# Patient Record
Sex: Male | Born: 1945 | Race: Black or African American | Hispanic: No | Marital: Married | Smoking: Never smoker
Health system: Southern US, Community
[De-identification: ages and names within clinical notes are randomized; demographics above are authoritative.]

## PROBLEM LIST (undated history)

## (undated) DIAGNOSIS — I10 Essential (primary) hypertension: Secondary | ICD-10-CM

## (undated) DIAGNOSIS — I2510 Atherosclerotic heart disease of native coronary artery without angina pectoris: Secondary | ICD-10-CM

## (undated) HISTORY — PX: CARDIAC SURGERY: SHX584

---

## 2016-07-08 ENCOUNTER — Emergency Department (HOSPITAL_COMMUNITY): Payer: Medicare Other

## 2016-07-08 ENCOUNTER — Encounter (HOSPITAL_COMMUNITY): Payer: Self-pay | Admitting: *Deleted

## 2016-07-08 ENCOUNTER — Inpatient Hospital Stay (HOSPITAL_COMMUNITY)
Admission: EM | Admit: 2016-07-08 | Discharge: 2016-07-17 | DRG: 299 | Disposition: A | Discharge status: Home Health | Payer: Medicare Other | Attending: Internal Medicine | Admitting: Internal Medicine

## 2016-07-08 DIAGNOSIS — E878 Other disorders of electrolyte and fluid balance, not elsewhere classified: Secondary | ICD-10-CM | POA: Diagnosis present

## 2016-07-08 DIAGNOSIS — J811 Chronic pulmonary edema: Secondary | ICD-10-CM

## 2016-07-08 DIAGNOSIS — E785 Hyperlipidemia, unspecified: Secondary | ICD-10-CM | POA: Diagnosis present

## 2016-07-08 DIAGNOSIS — Z951 Presence of aortocoronary bypass graft: Secondary | ICD-10-CM

## 2016-07-08 DIAGNOSIS — N179 Acute kidney failure, unspecified: Secondary | ICD-10-CM | POA: Diagnosis present

## 2016-07-08 DIAGNOSIS — M7989 Other specified soft tissue disorders: Secondary | ICD-10-CM | POA: Diagnosis not present

## 2016-07-08 DIAGNOSIS — I7101 Dissection of thoracic aorta: Secondary | ICD-10-CM | POA: Diagnosis present

## 2016-07-08 DIAGNOSIS — I129 Hypertensive chronic kidney disease with stage 1 through stage 4 chronic kidney disease, or unspecified chronic kidney disease: Secondary | ICD-10-CM | POA: Diagnosis present

## 2016-07-08 DIAGNOSIS — I2510 Atherosclerotic heart disease of native coronary artery without angina pectoris: Secondary | ICD-10-CM | POA: Diagnosis present

## 2016-07-08 DIAGNOSIS — E1151 Type 2 diabetes mellitus with diabetic peripheral angiopathy without gangrene: Secondary | ICD-10-CM | POA: Diagnosis present

## 2016-07-08 DIAGNOSIS — I714 Abdominal aortic aneurysm, without rupture: Secondary | ICD-10-CM | POA: Diagnosis present

## 2016-07-08 DIAGNOSIS — E871 Hypo-osmolality and hyponatremia: Secondary | ICD-10-CM | POA: Diagnosis not present

## 2016-07-08 DIAGNOSIS — E876 Hypokalemia: Secondary | ICD-10-CM | POA: Diagnosis not present

## 2016-07-08 DIAGNOSIS — E861 Hypovolemia: Secondary | ICD-10-CM | POA: Diagnosis not present

## 2016-07-08 DIAGNOSIS — K5900 Constipation, unspecified: Secondary | ICD-10-CM | POA: Diagnosis not present

## 2016-07-08 DIAGNOSIS — I161 Hypertensive emergency: Secondary | ICD-10-CM | POA: Diagnosis present

## 2016-07-08 DIAGNOSIS — R0600 Dyspnea, unspecified: Secondary | ICD-10-CM

## 2016-07-08 DIAGNOSIS — N289 Disorder of kidney and ureter, unspecified: Secondary | ICD-10-CM | POA: Diagnosis not present

## 2016-07-08 DIAGNOSIS — J189 Pneumonia, unspecified organism: Secondary | ICD-10-CM | POA: Diagnosis present

## 2016-07-08 DIAGNOSIS — Q613 Polycystic kidney, unspecified: Secondary | ICD-10-CM

## 2016-07-08 DIAGNOSIS — E1122 Type 2 diabetes mellitus with diabetic chronic kidney disease: Secondary | ICD-10-CM | POA: Diagnosis present

## 2016-07-08 DIAGNOSIS — N183 Chronic kidney disease, stage 3 (moderate): Secondary | ICD-10-CM | POA: Diagnosis present

## 2016-07-08 DIAGNOSIS — R079 Chest pain, unspecified: Secondary | ICD-10-CM | POA: Diagnosis not present

## 2016-07-08 DIAGNOSIS — J9690 Respiratory failure, unspecified, unspecified whether with hypoxia or hypercapnia: Secondary | ICD-10-CM | POA: Diagnosis not present

## 2016-07-08 DIAGNOSIS — I82412 Acute embolism and thrombosis of left femoral vein: Secondary | ICD-10-CM | POA: Diagnosis present

## 2016-07-08 DIAGNOSIS — Z8679 Personal history of other diseases of the circulatory system: Secondary | ICD-10-CM

## 2016-07-08 DIAGNOSIS — I71019 Dissection of thoracic aorta, unspecified: Secondary | ICD-10-CM | POA: Diagnosis present

## 2016-07-08 DIAGNOSIS — R262 Difficulty in walking, not elsewhere classified: Secondary | ICD-10-CM

## 2016-07-08 DIAGNOSIS — D649 Anemia, unspecified: Secondary | ICD-10-CM | POA: Diagnosis present

## 2016-07-08 DIAGNOSIS — I7102 Dissection of abdominal aorta: Secondary | ICD-10-CM | POA: Diagnosis not present

## 2016-07-08 DIAGNOSIS — I70213 Atherosclerosis of native arteries of extremities with intermittent claudication, bilateral legs: Secondary | ICD-10-CM | POA: Diagnosis not present

## 2016-07-08 DIAGNOSIS — M549 Dorsalgia, unspecified: Secondary | ICD-10-CM | POA: Diagnosis present

## 2016-07-08 HISTORY — DX: Atherosclerotic heart disease of native coronary artery without angina pectoris: I25.10

## 2016-07-08 HISTORY — DX: Essential (primary) hypertension: I10

## 2016-07-08 LAB — CBC WITH DIFFERENTIAL/PLATELET
Basophils Absolute: 0 10*3/uL (ref 0.0–0.1)
Basophils Relative: 0 %
EOS PCT: 2 %
Eosinophils Absolute: 0.2 10*3/uL (ref 0.0–0.7)
HCT: 33.5 % — ABNORMAL LOW (ref 39.0–52.0)
Hemoglobin: 11.1 g/dL — ABNORMAL LOW (ref 13.0–17.0)
LYMPHS ABS: 1 10*3/uL (ref 0.7–4.0)
LYMPHS PCT: 12 %
MCH: 27.1 pg (ref 26.0–34.0)
MCHC: 33.1 g/dL (ref 30.0–36.0)
MCV: 81.9 fL (ref 78.0–100.0)
Monocytes Absolute: 0.4 10*3/uL (ref 0.1–1.0)
Monocytes Relative: 5 %
Neutro Abs: 6.5 10*3/uL (ref 1.7–7.7)
Neutrophils Relative %: 81 %
PLATELETS: 220 10*3/uL (ref 150–400)
RBC: 4.09 MIL/uL — AB (ref 4.22–5.81)
RDW: 15.8 % — ABNORMAL HIGH (ref 11.5–15.5)
WBC: 8.1 10*3/uL (ref 4.0–10.5)

## 2016-07-08 LAB — I-STAT CHEM 8, ED
BUN: 39 mg/dL — AB (ref 6–20)
CALCIUM ION: 1.06 mmol/L — AB (ref 1.15–1.40)
CHLORIDE: 106 mmol/L (ref 101–111)
Creatinine, Ser: 3.5 mg/dL — ABNORMAL HIGH (ref 0.61–1.24)
GLUCOSE: 125 mg/dL — AB (ref 65–99)
HCT: 37 % — ABNORMAL LOW (ref 39.0–52.0)
Hemoglobin: 12.6 g/dL — ABNORMAL LOW (ref 13.0–17.0)
Potassium: 4.3 mmol/L (ref 3.5–5.1)
Sodium: 141 mmol/L (ref 135–145)
TCO2: 22 mmol/L (ref 0–100)

## 2016-07-08 LAB — I-STAT TROPONIN, ED: TROPONIN I, POC: 0.13 ng/mL — AB (ref 0.00–0.08)

## 2016-07-08 MED ORDER — SODIUM CHLORIDE 0.9 % IV SOLN
INTRAVENOUS | Status: DC
  Administered 2016-07-09: 01:00:00 via INTRAVENOUS

## 2016-07-08 MED ORDER — OXYCODONE-ACETAMINOPHEN 5-325 MG PO TABS
1.0000 | ORAL_TABLET | ORAL | Status: DC | PRN
Start: 2016-07-08 — End: 2016-07-17
  Administered 2016-07-09 – 2016-07-16 (×5): 1 via ORAL
  Filled 2016-07-08 (×5): qty 1

## 2016-07-08 MED ORDER — NITROGLYCERIN IN D5W 200-5 MCG/ML-% IV SOLN
0.0000 ug/min | Freq: Once | INTRAVENOUS | Status: AC
  Administered 2016-07-08: 20 ug/min via INTRAVENOUS
  Filled 2016-07-08: qty 250

## 2016-07-08 MED ORDER — FENTANYL CITRATE (PF) 100 MCG/2ML IJ SOLN
100.0000 ug | Freq: Once | INTRAMUSCULAR | Status: AC
  Administered 2016-07-08: 100 ug via INTRAVENOUS
  Filled 2016-07-08: qty 2

## 2016-07-08 MED ORDER — MORPHINE SULFATE (PF) 4 MG/ML IV SOLN
2.0000 mg | INTRAVENOUS | Status: DC | PRN
  Administered 2016-07-09 – 2016-07-16 (×3): 2 mg via INTRAVENOUS
  Filled 2016-07-08 (×3): qty 1

## 2016-07-08 MED ORDER — SODIUM CHLORIDE 0.9 % IV SOLN: INTRAVENOUS | Status: DC | PRN

## 2016-07-08 MED ORDER — ESMOLOL HCL-SODIUM CHLORIDE 2000 MG/100ML IV SOLN
25.0000 ug/kg/min | INTRAVENOUS | Status: DC
  Administered 2016-07-09: 40 ug/kg/min via INTRAVENOUS
  Administered 2016-07-09: 220 ug/kg/min via INTRAVENOUS
  Administered 2016-07-09: 280 ug/kg/min via INTRAVENOUS
  Administered 2016-07-09: 100 ug/kg/min via INTRAVENOUS
  Administered 2016-07-09: 160 ug/kg/min via INTRAVENOUS
  Administered 2016-07-09: 200 ug/kg/min via INTRAVENOUS
  Administered 2016-07-09: 300 ug/kg/min via INTRAVENOUS
  Filled 2016-07-08 (×9): qty 100

## 2016-07-08 MED ORDER — INSULIN ASPART 100 UNIT/ML ~~LOC~~ SOLN
0.0000 [IU] | Freq: Three times a day (TID) | SUBCUTANEOUS | Status: DC
  Administered 2016-07-09 – 2016-07-14 (×9): 1 [IU] via SUBCUTANEOUS

## 2016-07-08 MED ORDER — ESMOLOL HCL-SODIUM CHLORIDE 2000 MG/100ML IV SOLN
25.0000 ug/kg/min | Freq: Once | INTRAVENOUS | Status: AC
Start: 2016-07-08 — End: 2016-07-08
  Administered 2016-07-08: 25 ug/kg/min via INTRAVENOUS
  Filled 2016-07-08: qty 100

## 2016-07-08 MED ORDER — INSULIN ASPART 100 UNIT/ML ~~LOC~~ SOLN: 0.0000 [IU] | Freq: Every day | SUBCUTANEOUS | Status: DC

## 2016-07-08 MED ORDER — ACETAMINOPHEN 325 MG PO TABS: 650.0000 mg | ORAL_TABLET | Freq: Four times a day (QID) | ORAL | Status: DC | PRN

## 2016-07-08 NOTE — ED Triage Notes (Signed)
Patient was at dinner and began having severe back pain. EMS reports that patient was very hypertensive. BP- 290 palpated. .4 ntg and 324 mg ASA given per EMS

## 2016-07-08 NOTE — ED Notes (Signed)
Dr. Rubin PayorPickering was informed of this critical lab (KT)..Marland Kitchen

## 2016-07-08 NOTE — H&P (Signed)
PULMONARY / CRITICAL CARE MEDICINE   Name: Mario Mccann MRN: [id number] DOB: [birth year]    ADMISSION DATE:  07/08/2016  REFERRING MD:  Dr. Rubin PayorPickering, ER  CHIEF COMPLAINT:  Back pain  HISTORY OF PRESENT ILLNESS:   70 yo male was eating dinner and then developed severe back pain.  It was painful for him to take a deep breath, and he felt anxious.  He came to ER.  He was noted to have BP 230/137.  His CXR showed tortuous aorta.  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  He lived in [place] and received medical care there.  [place], and was in process of getting medical care set up with [facility] - he has not been assigned a primary care provider yet.  PAST MEDICAL HISTORY :  He  has a past medical history of Coronary artery disease and Hypertension.  PAST SURGICAL HISTORY: He  has a past surgical history that includes Cardiac surgery.  No Known Allergies  OUTPATIENT MEDICATIONS: He is not sure what medications he takes  FAMILY HISTORY:  His family history is significant for CAD, HTN  SOCIAL HISTORY: He denies history of smoking or excessive alcohol use  REVIEW OF SYSTEMS:   Negative except above  SUBJECTIVE:   VITAL SIGNS: BP (!) 184/122   Pulse 92   Temp 97.9 F (36.6 C) (Oral)   Resp 15   Ht 6\' 1"  (1.854 m)   Wt 198 lb (89.8 kg)   SpO2 97%   BMI 26.12 kg/m   INTAKE / OUTPUT: No intake/output data recorded.  PHYSICAL EXAMINATION: General:  alert Neuro:  Normal strength, CN intact HEENT:  Pupils reactive, wears glasses, no stridor, no LAN Cardiovascular:  Regular, no murmur, mid line chest scar Lungs:  No wheeze/rales Abdomen:  Soft, non tender, no organomegaly Musculoskeletal:  Mild ankle edema Skin: venous stasis changes  LABS:  BMET  Recent Labs Lab 07/08/16 2002  NA 141  K 4.3  CL 106  BUN 39*  CREATININE 3.50*  GLUCOSE 125*    Electrolytes No results  for input(s): CALCIUM, MG, PHOS in the last 168 hours.  CBC  Recent Labs Lab 07/08/16 1945 07/08/16 2002  WBC 8.1  --   HGB 11.1* 12.6*  HCT 33.5* 37.0*  PLT 220  --     Coag's No results for input(s): APTT, INR in the last 168 hours.  Sepsis Markers No results for input(s): LATICACIDVEN, PROCALCITON, O2SATVEN in the last 168 hours.  ABG No results for input(s): PHART, PCO2ART, PO2ART in the last 168 hours.  Liver Enzymes No results for input(s): AST, ALT, ALKPHOS, BILITOT, ALBUMIN in the last 168 hours.  Cardiac Enzymes No results for input(s): TROPONINI, PROBNP in the last 168 hours.  Glucose No results for input(s): GLUCAP in the last 168 hours.  Imaging Mr Maxine GlennMra Chest Wo Contrast  Result Date: 07/08/2016 CLINICAL DATA:  Severe back pain and hypertension. EXAM: MRA CHEST WITH OR WITHOUT CONTRAST TECHNIQUE: Angiographic images of the chest were obtained using MRA technique without and with intravenous contrast. CONTRAST:  None COMPARISON:  Chest radiograph 07/08/2016 FINDINGS: There is a type B dissection of the thoracic aorta, beginning just beyond the origin of the left subclavian artery and continuing throughout the descending thoracic aorta. Normal caliber of the stent aorta and arch through the great vessel origins. Great vessel origins are patent. The dissection appears to end just above  the diaphragmatic hiatus. No pleural effusions. At the bottom of this study, there is a strong suggestion of an infrarenal abdominal aortic aneurysm which could measure over 5 cm. This is poorly seen and the images at this level are degraded by motion artifact. Innumerable cysts throughout the liver and both kidneys, incompletely imaged. IMPRESSION: 1. Type B thoracic aortic dissection extending to just above the diaphragmatic hiatus. 2. Probable infrarenal abdominal aortic aneurysm, possibly over 5 cm. 3. Normal caliber of the ascending aorta and arch through the great vessel origins. Great  vessel origins are patent. 4. These results will be called to the ordering clinician or representative by the Radiologist Assistant, and communication documented in the PACS or zVision Dashboard. Electronically Signed   By: Ellery Plunkaniel R Mitchell M.D.   On: 07/08/2016 22:53   Dg Chest Portable 1 View  Result Date: 07/08/2016 CLINICAL DATA:  Severe back pain EXAM: PORTABLE CHEST 1 VIEW COMPARISON:  None. FINDINGS: Cardiac shadow is at the upper limits of normal in size. Postsurgical changes are seen. Tortuosity of the thoracic aorta is noted. Aortic calcifications are seen. No acute bony abnormality is noted. The lungs are clear. IMPRESSION: Tortuous thoracic aorta. Given the patient's history the possibility of dissection deserves consideration. Electronically Signed   By: Alcide CleverMark  Lukens M.D.   On: 07/08/2016 20:16     STUDIES:  MRA chest 11/03 >> type B aortic dissection from just beyond Lt subclavian throughout descending thoracic aorta ending about diaphragmatic hiatus, infrarenal AAA 5 cm, liver/renal cysts  CULTURES:  ANTIBIOTICS:  SIGNIFICANT EVENTS: 11/03 Admit  LINES/TUBES:  DISCUSSION: 70 yo male with sudden onset of back pain from HTN emergency and type B aortic dissection.  ASSESSMENT / PLAN:  HTN emergency with type B aortic dissection. Hx of CAD s/p CABG, HTN, HLD. PAD with infrarenal AAA. - esmolol gtt - goal SBP < 130, HR < 80 - f/u Echo - will likely need further assessment by vascular surgery - will need to get records from medical team in [place]  DM type II. - SSI  CKD 3 - not sure what baseline renal fx is. - monitor renal fx, urine outpt  Pain control. - prn analgesics  Nutrition. - clear liquid diet   DVT prophylaxis - SCDs SUP - not indicated Goals of care - full code  CC time 32 minutes  Coralyn HellingVineet Jannae Fagerstrom, MD [facility] Pulmonary/Critical Care 07/08/2016, 11:37 PM Pager:  [phone] After 3pm call: [phone]

## 2016-07-08 NOTE — ED Provider Notes (Signed)
[facility]-EMERGENCY DEPT Provider Note   CSN: [id number] Arrival date & time: 07/08/16  1851     History   Chief Complaint Chief Complaint  Patient presents with  . Back Pain    HPI Mario Mccann is a 70 y.o. male.  HPI Patient was at dinner he developed acute mid thoracic back pain it is dull. No nausea vomiting. No numbness weakness. Previous history of MI CABG and stents. Last cath was about a year ago. Pain is now down to his left chest also. He was found to be severely hypertensive with a palpate a blood pressure 290 by EMS. Systolic of 230 here. States he did not take his medicines today because he was busy. States he had some improvement in the pain with nitroglycerin given to him by EMS. States he also took 4 baby aspirin.    Past Medical History:  Diagnosis Date  . Coronary artery disease   . Hypertension     There are no active problems to display for this patient.   Past Surgical History:  Procedure Laterality Date  . CARDIAC SURGERY         Home Medications    Prior to Admission medications   Not on File    Family History No family history on file.  Social History Social History  Substance Use Topics  . Smoking status: Not on file  . Smokeless tobacco: Not on file  . Alcohol use Not on file     Allergies   Review of patient's allergies indicates not on file.   Review of Systems Review of Systems  Constitutional: Negative for appetite change.  HENT: Negative for drooling.   Respiratory: Negative for shortness of breath.   Cardiovascular: Positive for chest pain.  Gastrointestinal: Negative for abdominal pain.  Genitourinary: Negative for difficulty urinating.  Musculoskeletal: Negative for back pain.  Neurological: Negative for numbness.  Psychiatric/Behavioral: Negative for confusion.     Physical Exam Updated Vital Signs BP (!) 197/123   Pulse 96   Temp 97.9 F (36.6 C) (Oral)   Resp 18   Ht 6\' 1"  (1.854 m)   Wt 198 lb (89.8  kg)   SpO2 94%   BMI 26.12 kg/m   Physical Exam  Constitutional: He appears well-developed.  HENT:  Head: Atraumatic.  Neck: Neck supple.  Cardiovascular: Normal rate.   Scar on mid chest from previous CABG  Pulmonary/Chest: Effort normal.  Abdominal: Soft. There is no tenderness.  Musculoskeletal: He exhibits no edema.  Neurological: He is alert.  Skin: Skin is warm. Capillary refill takes less than 2 seconds.     ED Treatments / Results  Labs (all labs ordered are listed, but only abnormal results are displayed) Labs Reviewed  CBC WITH DIFFERENTIAL/PLATELET - Abnormal; Notable for the following:       Result Value   RBC 4.09 (*)    Hemoglobin 11.1 (*)    HCT 33.5 (*)    RDW 15.8 (*)    All other components within normal limits  I-STAT CHEM 8, ED - Abnormal; Notable for the following:    BUN 39 (*)    Creatinine, Ser 3.50 (*)    Glucose, Bld 125 (*)    Calcium, Ion 1.06 (*)    Hemoglobin 12.6 (*)    HCT 37.0 (*)    All other components within normal limits  I-STAT TROPOININ, ED - Abnormal; Notable for the following:    Troponin i, poc 0.13 (*)    All other  components within normal limits    EKG  EKG Interpretation  Date/Time:  Friday July 08 2016 18:52:31 EDT Ventricular Rate:  81 PR Interval:    QRS Duration: 98 QT Interval:  393 QTC Calculation: 457 R Axis:   -34 Text Interpretation:  Sinus rhythm Probable left atrial enlargement Left ventricular hypertrophy Confirmed by Rubin Payor  MD, Harrold Donath [phone]) on 07/08/2016 7:30:47 PM       Radiology Mr Maxine Glenn Chest Wo Contrast  Result Date: 07/08/2016 CLINICAL DATA:  Severe back pain and hypertension. EXAM: MRA CHEST WITH OR WITHOUT CONTRAST TECHNIQUE: Angiographic images of the chest were obtained using MRA technique without and with intravenous contrast. CONTRAST:  None COMPARISON:  Chest radiograph 07/08/2016 FINDINGS: There is a type B dissection of the thoracic aorta, beginning just beyond the origin of  the left subclavian artery and continuing throughout the descending thoracic aorta. Normal caliber of the stent aorta and arch through the great vessel origins. Great vessel origins are patent. The dissection appears to end just above the diaphragmatic hiatus. No pleural effusions. At the bottom of this study, there is a strong suggestion of an infrarenal abdominal aortic aneurysm which could measure over 5 cm. This is poorly seen and the images at this level are degraded by motion artifact. Innumerable cysts throughout the liver and both kidneys, incompletely imaged. IMPRESSION: 1. Type B thoracic aortic dissection extending to just above the diaphragmatic hiatus. 2. Probable infrarenal abdominal aortic aneurysm, possibly over 5 cm. 3. Normal caliber of the ascending aorta and arch through the great vessel origins. Great vessel origins are patent. 4. These results will be called to the ordering clinician or representative by the Radiologist Assistant, and communication documented in the PACS or zVision Dashboard. Electronically Signed   By: Ellery Plunk M.D.   On: 07/08/2016 22:53   Dg Chest Portable 1 View  Result Date: 07/08/2016 CLINICAL DATA:  Severe back pain EXAM: PORTABLE CHEST 1 VIEW COMPARISON:  None. FINDINGS: Cardiac shadow is at the upper limits of normal in size. Postsurgical changes are seen. Tortuosity of the thoracic aorta is noted. Aortic calcifications are seen. No acute bony abnormality is noted. The lungs are clear. IMPRESSION: Tortuous thoracic aorta. Given the patient's history the possibility of dissection deserves consideration. Electronically Signed   By: Alcide Clever M.D.   On: 07/08/2016 20:16    Procedures Procedures (including critical care time)  Medications Ordered in ED Medications  esmolol (BREVIBLOC) 2000 mg / 100 mL (20 mg/mL) infusion (not administered)  nitroGLYCERIN 50 mg in dextrose 5 % 250 mL (0.2 mg/mL) infusion (30 mcg/min Intravenous Rate/Dose Change  07/08/16 2245)  fentaNYL (SUBLIMAZE) injection 100 mcg (100 mcg Intravenous Given 07/08/16 2238)     Initial Impression / Assessment and Plan / ED Course  I have reviewed the triage vital signs and the nursing notes.  Pertinent labs & imaging results that were available during my care of the patient were reviewed by me and considered in my medical decision making (see chart for details).  Clinical Course    Patient with chest pain that came from the back. States pain is worse in the back. Severe hypertension. Has been off his medicines today. Found to have type B aortic dissection. Also 5 cm abdominal aortic aneurysm. Started on nitroglycerin and was all drips. Fentanyl given for pain which helped. MRI done due to renal insufficiency. Creatinine is 3.5 and unsure of baseline. Patient cannot really tell me much about his history. Discussed with Dr. Jamison Neighbor  from ICU, who will admit the patient.  CRITICAL CARE Performed by: Billee CashingPICKERING,Sanayah Munro R. Total critical care time: 45 minutes Critical care time was exclusive of separately billable procedures and treating other patients. Critical care was necessary to treat or prevent imminent or life-threatening deterioration. Critical care was time spent personally by me on the following activities: development of treatment plan with patient and/or surrogate as well as nursing, discussions with consultants, evaluation of patient's response to treatment, examination of patient, obtaining history from patient or surrogate, ordering and performing treatments and interventions, ordering and review of laboratory studies, ordering and review of radiographic studies, pulse oximetry and re-evaluation of patient's condition.   Final Clinical Impressions(s) / ED Diagnoses   Final diagnoses:  Chest pain  Aortic dissection, thoracic [facility](HCC)    New Prescriptions New Prescriptions   No medications on file     Benjiman CoreNathan Ulice Follett, MD 07/08/16 2302

## 2016-07-08 NOTE — ED Notes (Signed)
Report attempted, RN unable to get report at this time. 

## 2016-07-08 NOTE — ED Notes (Signed)
Patient returned from MRI.

## 2016-07-09 DIAGNOSIS — I70213 Atherosclerosis of native arteries of extremities with intermittent claudication, bilateral legs: Secondary | ICD-10-CM

## 2016-07-09 DIAGNOSIS — N289 Disorder of kidney and ureter, unspecified: Secondary | ICD-10-CM

## 2016-07-09 DIAGNOSIS — I7102 Dissection of abdominal aorta: Secondary | ICD-10-CM

## 2016-07-09 LAB — CBC
HEMATOCRIT: 26.3 % — AB (ref 39.0–52.0)
HEMATOCRIT: 30.2 % — AB (ref 39.0–52.0)
HEMOGLOBIN: 8.6 g/dL — AB (ref 13.0–17.0)
HEMOGLOBIN: 9.9 g/dL — AB (ref 13.0–17.0)
MCH: 26.8 pg (ref 26.0–34.0)
MCH: 27.4 pg (ref 26.0–34.0)
MCHC: 32.7 g/dL (ref 30.0–36.0)
MCHC: 32.8 g/dL (ref 30.0–36.0)
MCV: 81.9 fL (ref 78.0–100.0)
MCV: 83.7 fL (ref 78.0–100.0)
Platelets: 227 10*3/uL (ref 150–400)
Platelets: 190 10*3/uL (ref 150–400)
RBC: 3.21 MIL/uL — ABNORMAL LOW (ref 4.22–5.81)
RBC: 3.61 MIL/uL — AB (ref 4.22–5.81)
RDW: 15.7 % — AB (ref 11.5–15.5)
RDW: 15.7 % — ABNORMAL HIGH (ref 11.5–15.5)
WBC: 7.6 10*3/uL (ref 4.0–10.5)
WBC: 9 10*3/uL (ref 4.0–10.5)

## 2016-07-09 LAB — GLUCOSE, CAPILLARY
GLUCOSE-CAPILLARY: 112 mg/dL — AB (ref 65–99)
GLUCOSE-CAPILLARY: 150 mg/dL — AB (ref 65–99)
Glucose-Capillary: 121 mg/dL — ABNORMAL HIGH (ref 65–99)
Glucose-Capillary: 121 mg/dL — ABNORMAL HIGH (ref 65–99)
Glucose-Capillary: 141 mg/dL — ABNORMAL HIGH (ref 65–99)

## 2016-07-09 LAB — TYPE AND SCREEN
ABO/RH(D): O POS
ANTIBODY SCREEN: NEGATIVE

## 2016-07-09 LAB — BASIC METABOLIC PANEL
ANION GAP: 7 (ref 5–15)
Anion gap: 5 (ref 5–15)
BUN: 29 mg/dL — AB (ref 6–20)
BUN: 37 mg/dL — AB (ref 6–20)
CHLORIDE: 109 mmol/L (ref 101–111)
CO2: 15 mmol/L — ABNORMAL LOW (ref 22–32)
CO2: 22 mmol/L (ref 22–32)
CREATININE: 3.13 mg/dL — AB (ref 0.61–1.24)
Calcium: 6.1 mg/dL — CL (ref 8.9–10.3)
Calcium: 8.4 mg/dL — ABNORMAL LOW (ref 8.9–10.3)
Chloride: 117 mmol/L — ABNORMAL HIGH (ref 101–111)
Creatinine, Ser: 2.38 mg/dL — ABNORMAL HIGH (ref 0.61–1.24)
GFR calc Af Amer: 30 mL/min — ABNORMAL LOW (ref >60)
GFR calc non Af Amer: 19 mL/min — ABNORMAL LOW (ref >60)
GFR, EST AFRICAN AMERICAN: 22 mL/min — AB (ref >60)
GFR, EST NON AFRICAN AMERICAN: 26 mL/min — AB (ref >60)
GLUCOSE: 106 mg/dL — AB (ref 65–99)
Glucose, Bld: 79 mg/dL (ref 65–99)
POTASSIUM: 3.2 mmol/L — AB (ref 3.5–5.1)
POTASSIUM: 4.3 mmol/L (ref 3.5–5.1)
SODIUM: 139 mmol/L (ref 135–145)
SODIUM: 136 mmol/L (ref 135–145)

## 2016-07-09 LAB — MRSA PCR SCREENING: MRSA by PCR: NEGATIVE

## 2016-07-09 LAB — ABO/RH: ABO/RH(D): O POS

## 2016-07-09 LAB — HEMOGLOBIN AND HEMATOCRIT, BLOOD
HCT: 28.5 % — ABNORMAL LOW (ref 39.0–52.0)
Hemoglobin: 9.5 g/dL — ABNORMAL LOW (ref 13.0–17.0)

## 2016-07-09 MED ORDER — SODIUM CHLORIDE 0.9 % IV SOLN
30.0000 meq | Freq: Four times a day (QID) | INTRAVENOUS | Status: AC
  Administered 2016-07-09: 30 meq via INTRAVENOUS
  Filled 2016-07-09 (×2): qty 15

## 2016-07-09 MED ORDER — NICARDIPINE HCL IN NACL 20-0.86 MG/200ML-% IV SOLN
3.0000 mg/h | INTRAVENOUS | Status: DC
  Administered 2016-07-09 (×2): 5 mg/h via INTRAVENOUS
  Administered 2016-07-09: 4 mg/h via INTRAVENOUS
  Administered 2016-07-10 (×2): 3 mg/h via INTRAVENOUS
  Administered 2016-07-10: 6 mg/h via INTRAVENOUS
  Administered 2016-07-10: 7 mg/h via INTRAVENOUS
  Administered 2016-07-10: 6 mg/h via INTRAVENOUS
  Administered 2016-07-11 – 2016-07-12 (×3): 2 mg/h via INTRAVENOUS
  Filled 2016-07-09 (×15): qty 200

## 2016-07-09 MED ORDER — SODIUM CHLORIDE 0.9 % IV SOLN
1.0000 g | Freq: Once | INTRAVENOUS | Status: AC
  Administered 2016-07-09: 1 g via INTRAVENOUS
  Filled 2016-07-09: qty 10

## 2016-07-09 MED ORDER — ONDANSETRON HCL 4 MG/2ML IJ SOLN
INTRAMUSCULAR | Status: AC
  Administered 2016-07-09: 4 mg
  Filled 2016-07-09: qty 2

## 2016-07-09 MED ORDER — ONDANSETRON HCL 4 MG/2ML IJ SOLN
4.0000 mg | Freq: Four times a day (QID) | INTRAMUSCULAR | Status: DC | PRN
  Administered 2016-07-09: 4 mg via INTRAVENOUS
  Filled 2016-07-09: qty 2

## 2016-07-09 MED ORDER — POTASSIUM CHLORIDE 20 MEQ/15ML (10%) PO SOLN
20.0000 meq | Freq: Once | ORAL | Status: AC
  Administered 2016-07-09: 20 meq via ORAL
  Filled 2016-07-09: qty 15

## 2016-07-09 NOTE — Progress Notes (Signed)
eLink Physician-Brief Progress Note Patient Name: Mario Mccann DOB: 16-Oct-1945 MRN: [id number]   Date of Service  07/09/2016  HPI/Events of Note  K 3.2. Calcium 6.1. Has PIV. Creatinine 2.38. Taking PO.  eICU Interventions  1. Checking ionized calcium 2. Stat Type & Screen 3. Repeat Hgb/Hct @ noon 4. Calcium Gluconate 1gm IV 5. KCl 30mEq PO x1     Intervention Category Intermediate Interventions: Electrolyte abnormality - evaluation and management  Mario Mccann 07/09/2016, 6:36 AM

## 2016-07-09 NOTE — Progress Notes (Signed)
PULMONARY / CRITICAL CARE MEDICINE   Name: Mario RoyalsCarl Mccann MRN: [id number] DOB: June 06, 1946    ADMISSION DATE:  07/08/2016  REFERRING MD:  Dr. Rubin PayorPickering, ER  CHIEF COMPLAINT:  Back pain  HISTORY OF PRESENT ILLNESS:   70 yo male was eating dinner and then developed severe back pain.  It was painful for him to take a deep breath, and he felt anxious.  He came to ER.  He was noted to have BP 230/137.  His CXR showed tortuous aorta.  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  He lived in [place] and received medical care there.  [place], and was in process of getting medical care set up with [facility] - he has not been assigned a primary care provider yet.   SUBJECTIVE:  Pain improved.  Having some nausea BP not at goal as of yet  VITAL SIGNS: BP (!) 173/106   Pulse 75   Temp 97.6 F (36.4 C) (Oral)   Resp (!) 23   Ht 6\' 1"  (1.854 m)   Wt 189 lb (85.7 kg)   SpO2 90%   BMI 24.94 kg/m   INTAKE / OUTPUT: I/O last 3 completed shifts: In: 641 [P.O.:360; I.V.:281] Out: 225 [Urine:225]  PHYSICAL EXAMINATION: General:  Alert; no distress.  Neuro:  Normal strength, CN intact HEENT:  Pupils reactive, wears glasses, no stridor, no LAN Cardiovascular:  Regular, no murmur, mid line chest scar Lungs:  No wheeze/rales, no accessory use  Abdomen:  Soft, non tender, no organomegaly Musculoskeletal:  Mild ankle edema Skin: venous stasis changes  LABS:  BMET  Recent Labs Lab 07/08/16 2002 07/09/16 0500  NA 141 139  K 4.3 3.2*  CL 106 117*  CO2  --  15*  BUN 39* 29*  CREATININE 3.50* 2.38*  GLUCOSE 125* 79    Electrolytes  Recent Labs Lab 07/09/16 0500  CALCIUM 6.1*    CBC  Recent Labs Lab 07/08/16 1945 07/08/16 2002 07/09/16 0500  WBC 8.1  --  7.6  HGB 11.1* 12.6* 8.6*  HCT 33.5* 37.0* 26.3*  PLT 220  --  227    Coag's No results for input(s): APTT, INR in the last 168  hours.  Sepsis Markers No results for input(s): LATICACIDVEN, PROCALCITON, O2SATVEN in the last 168 hours.  ABG No results for input(s): PHART, PCO2ART, PO2ART in the last 168 hours.  Liver Enzymes No results for input(s): AST, ALT, ALKPHOS, BILITOT, ALBUMIN in the last 168 hours.  Cardiac Enzymes No results for input(s): TROPONINI, PROBNP in the last 168 hours.  Glucose  Recent Labs Lab 07/09/16 0042 07/09/16 0754  GLUCAP 121* 112*    Imaging Mr Mra Chest Wo Contrast  Result Date: 07/08/2016 CLINICAL DATA:  Severe back pain and hypertension. EXAM: MRA CHEST WITH OR WITHOUT CONTRAST TECHNIQUE: Angiographic images of the chest were obtained using MRA technique without and with intravenous contrast. CONTRAST:  None COMPARISON:  Chest radiograph 07/08/2016 FINDINGS: There is a type B dissection of the thoracic aorta, beginning just beyond the origin of the left subclavian artery and continuing throughout the descending thoracic aorta. Normal caliber of the stent aorta and arch through the great vessel origins. Great vessel origins are patent. The dissection appears to end just above the diaphragmatic hiatus. No pleural effusions. At the bottom of this study, there is a strong suggestion of an infrarenal abdominal aortic aneurysm which could measure over 5 cm.  This is poorly seen and the images at this level are degraded by motion artifact. Innumerable cysts throughout the liver and both kidneys, incompletely imaged. IMPRESSION: 1. Type B thoracic aortic dissection extending to just above the diaphragmatic hiatus. 2. Probable infrarenal abdominal aortic aneurysm, possibly over 5 cm. 3. Normal caliber of the ascending aorta and arch through the great vessel origins. Great vessel origins are patent. 4. These results will be called to the ordering clinician or representative by the Radiologist Assistant, and communication documented in the PACS or zVision Dashboard. Electronically Signed   By:  Daniel R Mitchell M.D.   On: 07/08/2016 22:53   Dg Chest Portable 1 View  Result Date: 07/08/2016 CLINICAL DATA:  SevereEllery Plunk back pain EXAM: PORTABLE CHEST 1 VIEW COMPARISON:  None. FINDINGS: Cardiac shadow is at the upper limits of normal in size. Postsurgical changes are seen. Tortuosity of the thoracic aorta is noted. Aortic calcifications are seen. No acute bony abnormality is noted. The lungs are clear. IMPRESSION: Tortuous thoracic aorta. Given the patient's history the possibility of dissection deserves consideration. Electronically Signed   By: Alcide CleverMark  Lukens M.D.   On: 07/08/2016 20:16     STUDIES:  MRA chest 11/03 >> type B aortic dissection from just beyond Lt subclavian throughout descending thoracic aorta ending about diaphragmatic hiatus, infrarenal AAA 5 cm, liver/renal cysts  CULTURES:  ANTIBIOTICS:  SIGNIFICANT EVENTS: 11/03 Admit 11/4 added nicardipine as esmolol maxed out. Asked vascular to see  LINES/TUBES:  DISCUSSION: 70 yo male with sudden onset of back pain from HTN emergency and type B aortic dissection. Working on BP control. Spoke w/ Dr Edilia Boickson. He will review imaging w/ [facility] on 11/5 and ask him to make recommendations on f/u imaging etc...  ASSESSMENT / PLAN:  HTN emergency with type B aortic dissection. Hx of CAD s/p CABG, HTN, HLD. PAD with infrarenal AAA. - esmolol gtt; adding nicardipine  - goal SBP < 130, HR < 80 - f/u Echo - vascular to review imaging on 11/5, although will ask them to emergently assess should he develop evidence of end-organ dysfxn.  - will need to get records from medical team in [place]  DM type II. - SSI  CKD 3 - not sure what baseline renal fx is-->cr improved.  NAGMA in setting of hyperchloremia Hypokalemia  - control BP - allow free water intake - monitor renal fx, urine outpt - replace K  - f/u am labs  Pain control. - prn analgesics  Nutrition. - clear liquid diet   DVT prophylaxis - SCDs SUP - not  indicated Goals of care - full code   Simonne MartinetPeter E Morganne Haile ACNP-BC [facility] Pulmonary/Critical Care Pager # [phone] OR # [phone] if no answer

## 2016-07-09 NOTE — ED Notes (Signed)
Pt requesting some medication for leg cramping at this time.

## 2016-07-09 NOTE — Progress Notes (Signed)
CRITICAL VALUE ALERT  Critical value received:  Ca++ 6.1  Date of notification:  07/09/16  Time of notification:  0630  Critical value read back: Yes   Nurse who received alert:  M. Manson PasseyBrown, RN  MD notified (1st page):  Dr. Jamison NeighborNestor  Time of first page:  [phone]  MD notified (2nd page):  Time of second page:  Responding MD:  Dr. Jamison NeighborNestor  Time MD responded:  [phone]

## 2016-07-09 NOTE — Consult Note (Addendum)
Patient name: Mario Mccann MRN: [id number] DOB: Sep 14, 1945 Sex: male  REASON FOR CONSULT: Type B aortic dissection. Consult is from critical care medicine.  HPI: Mario RoyalsCarl Mccann is a 70 y.o. male, who developed the sudden onset of back pain. He was admitted and workup included an MRI which showed evidence of a type B aortic dissection. His blood pressure is being controlled by critical care medicine. Vascular surgery was asked to discuss follow up.  His back pain has essentially resolved.  Of note, he did undergo an endovascular aneurysm repair in [place] several months ago. I do not have these records. He was not sure how large the aneurysm was. He had one follow up visit as an outpatient but states that he has not had a follow up CT scan since his repair.  He has had intermittent swelling in the left leg for many years.  He describes bilateral lower extreme claudication which is more significant on the right side. He denies any history of rest pain or nonhealing ulcers.  Past Medical History:  Diagnosis Date  . Coronary artery disease   . Hypertension     No family history on file.  SOCIAL HISTORY: Social History   Social History  . Marital status: Married    Spouse name: N/A  . Number of children: N/A  . Years of education: N/A   Occupational History  . Not on file.   Social History Main Topics  . Smoking status: Not on file  . Smokeless tobacco: Not on file  . Alcohol use Not on file  . Drug use: Unknown  . Sexual activity: Not on file   Other Topics Concern  . Not on file   Social History Narrative  . No narrative on file    No Known Allergies  Current Facility-Administered Medications  Medication Dose Route Frequency Provider Last Rate Last Dose  . 0.9 %  sodium chloride infusion   Intra-arterial PRN Coralyn HellingVineet Sood, MD      . 0.9 %  sodium chloride infusion   Intravenous Continuous Coralyn HellingVineet Sood, MD 10 mL/hr at 07/09/16 0600    . acetaminophen  (TYLENOL) tablet 650 mg  650 mg Oral Q6H PRN Coralyn HellingVineet Sood, MD      . esmolol (BREVIBLOC) 2000 mg / 100 mL (20 mg/mL) infusion  25-300 mcg/kg/min Intravenous Titrated Coralyn HellingVineet Sood, MD 75.4 mL/hr at 07/09/16 1128 280 mcg/kg/min at 07/09/16 1128  . insulin aspart (novoLOG) injection 0-5 Units  0-5 Units Subcutaneous QHS Coralyn HellingVineet Sood, MD      . insulin aspart (novoLOG) injection 0-9 Units  0-9 Units Subcutaneous TID WC Coralyn HellingVineet Sood, MD      . morphine 4 MG/ML injection 2 mg  2 mg Intravenous Q2H PRN Coralyn HellingVineet Sood, MD   2 mg at 07/09/16 0540  . nicardipine (CARDENE) 20mg  in 0.86% saline 200ml IV infusion (0.1 mg/ml)  3-15 mg/hr Intravenous Continuous Simonne MartinetPeter E Babcock, NP 50 mL/hr at 07/09/16 1103 5 mg/hr at 07/09/16 1103  . ondansetron (ZOFRAN) injection 4 mg  4 mg Intravenous Q6H PRN Simonne MartinetPeter E Babcock, NP   4 mg at 07/09/16 1123  . oxyCODONE-acetaminophen (PERCOCET/ROXICET) 5-325 MG per tablet 1 tablet  1 tablet Oral Q4H PRN Coralyn HellingVineet Sood, MD   1 tablet at 07/09/16 0155  . potassium chloride 30 mEq in sodium chloride 0.9 % 265 mL (KCL MULTIRUN) IVPB  30 mEq Intravenous Q6H Simonne MartinetPeter E Babcock, NP        REVIEW OF SYSTEMS:  [X]   denotes positive finding, [ ]  denotes negative finding Cardiac  Comments:  Chest pain or chest pressure:    Shortness of breath upon exertion:    Short of breath when lying flat:    Irregular heart rhythm:        Vascular    Pain in calf, thigh, or hip brought on by ambulation: X Bilateral, right greater than left   Pain in feet at night that wakes you up from your sleep:     Blood clot in your veins:    Leg swelling:  X Left leg, intermittent       Pulmonary    Oxygen at home:    Productive cough:     Wheezing:         Neurologic    Sudden weakness in arms or legs:     Sudden numbness in arms or legs:     Sudden onset of difficulty speaking or slurred speech:    Temporary loss of vision in one eye:     Problems with dizziness:         Gastrointestinal    Blood in stool:       Vomited blood:         Genitourinary    Burning when urinating:     Blood in urine:        Psychiatric    Major depression:         Hematologic    Bleeding problems:    Problems with blood clotting too easily:        Skin    Rashes or ulcers:        Constitutional    Fever or chills:      PHYSICAL EXAM: Vitals:   07/09/16 0800 07/09/16 0815 07/09/16 0830 07/09/16 1100  BP: (!) 138/102  (!) 173/106   Pulse: 60 72 75   Resp: 17 18 (!) 23   Temp:    97.8 F (36.6 C)  TempSrc:    Oral  SpO2: 97% 95% 90%   Weight:      Height:        GENERAL: The patient is a well-nourished male, in no acute distress. The vital signs are documented above. CARDIAC: There is a regular rate and rhythm.  VASCULAR: I do not detect carotid bruits. On the right side, he has a palpable femoral pulse. I cannot palpate a popliteal or pedal pulses. He has a monophasic posterior tibial signal with the Doppler. I cannot obtain a dorsalis pedis signal on the right with the Doppler. On the left side, he has a palpable femoral and popliteal pulse. I cannot palpate pedal pulses. He has a monophasic dorsalis pedis and posterior tibial signal on the left with the Doppler. He has left lower extremity swelling. PULMONARY: There is good air exchange bilaterally without wheezing or rales. ABDOMEN: Soft and non-tender with normal pitched bowel sounds.  MUSCULOSKELETAL: There are no major deformities or cyanosis. NEUROLOGIC: No focal weakness or paresthesias are detected. SKIN: There are no ulcers or rashes noted. PSYCHIATRIC: The patient has a normal affect.  DATA:   MRI: He has a type B aortic dissection which ends above the level of the diaphragmatic hiatus.  MEDICAL ISSUES:  TYPE B AORTIC DISSECTION: This patient has a type B aortic dissection by MRI and his blood pressure is being tightly controlled by critical care medicine. He does not appear to have compromised any branching vessels. He will need  follow up with Dr. Myra GianottiBrabham, and  I will discuss the timing of this with Dr. Myra Gianotti.  STATUS POST PERCUTANEOUS ENDOVASCULAR ANEURYSM REPAIR IN [place]: He tells me that he had an endovascular aneurysm repair in [place] proximally 3 months ago. I do not have these records. I will obtain a CT of the abdomen and pelvis without contrast (given his renal insufficiency) as he tells me that he has not had a postoperative follow up study since his repair. He will need continued follow up of his EVAR.  PERIPHERAL VASCULAR DISEASE: The patient does describe claudication bilaterally but more significantly on the right side. 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 I will obtain baseline ABIs while he is here. We can also follow this as an outpatient.  INTERMITTENT, CHRONIC, LEFT LOWER EXTREMITY SWELLING: He tells me that he has had intermittent swelling of the left lower extremity for some time. I will obtain a venous duplex scan while he is hospitalized. He is unaware of any previous history of DVT.   Waverly Ferrari [facility] [phone]

## 2016-07-09 NOTE — Procedures (Signed)
Arterial Catheter Insertion Procedure Note Serafina RoyalsCarl Boehringer [id number] 08/05/1946  Procedure: Insertion of Arterial Catheter  Indications: Blood pressure monitoring  Procedure Details Consent: Risks of procedure as well as the alternatives and risks of each were explained to the (patient/caregiver).  Consent for procedure obtained. Time Out: Verified patient identification, verified procedure, site/side was marked, verified correct patient position, special equipment/implants available, medications/allergies/relevent history reviewed, required imaging and test results available.  Performed  Maximum sterile technique was used including antiseptics, cap, gloves, gown, hand hygiene, mask and sheet. Skin prep: Chlorhexidine; local anesthetic administered 20 gauge catheter was inserted into right radial artery using the Seldinger technique.  Evaluation Blood flow good; BP tracing good. Complications: No apparent complications.   Vilinda BlanksMike Jr, Burna CashFrank William 07/09/2016

## 2016-07-10 ENCOUNTER — Inpatient Hospital Stay (HOSPITAL_COMMUNITY): Payer: Medicare Other

## 2016-07-10 ENCOUNTER — Encounter (HOSPITAL_COMMUNITY): Payer: Self-pay | Admitting: *Deleted

## 2016-07-10 DIAGNOSIS — N179 Acute kidney failure, unspecified: Secondary | ICD-10-CM

## 2016-07-10 DIAGNOSIS — R079 Chest pain, unspecified: Secondary | ICD-10-CM

## 2016-07-10 LAB — ECHOCARDIOGRAM COMPLETE
EERAT: 10.98
EWDT: 246 ms
FS: 36 % (ref 28–44)
Height: 73 in
IVS/LV PW RATIO, ED: 0.85
LA ID, A-P, ES: 38 mm
LA diam end sys: 38 mm
LA vol A4C: 43 ml
LADIAMINDEX: 1.82 cm/m2
LAVOL: 49.1 mL
LAVOLIN: 23.5 mL/m2
LV E/e' medial: 10.98
LV E/e'average: 10.98
LV PW d: 12.8 mm — AB (ref 0.6–1.1)
LV TDI E'MEDIAL: 6.2
LVELAT: 8.7 cm/s
MV Dec: 246
MV pk E vel: 95.5 m/s
MVPG: 4 mmHg
MVPKAVEL: 105 m/s
RV LATERAL S' VELOCITY: 11.1 cm/s
RV TAPSE: 16.1 mm
TDI e' lateral: 8.7
Weight: 3001.78 oz

## 2016-07-10 LAB — CBC
HEMATOCRIT: 28.6 % — AB (ref 39.0–52.0)
Hemoglobin: 9.4 g/dL — ABNORMAL LOW (ref 13.0–17.0)
MCH: 27 pg (ref 26.0–34.0)
MCHC: 32.9 g/dL (ref 30.0–36.0)
MCV: 82.2 fL (ref 78.0–100.0)
Platelets: 222 10*3/uL (ref 150–400)
RBC: 3.48 MIL/uL — AB (ref 4.22–5.81)
RDW: 15.6 % — AB (ref 11.5–15.5)
WBC: 9.4 10*3/uL (ref 4.0–10.5)

## 2016-07-10 LAB — BASIC METABOLIC PANEL
ANION GAP: 7 (ref 5–15)
Anion gap: 7 (ref 5–15)
BUN: 41 mg/dL — ABNORMAL HIGH (ref 6–20)
BUN: 40 mg/dL — AB (ref 6–20)
CALCIUM: 8.2 mg/dL — AB (ref 8.9–10.3)
CO2: 21 mmol/L — AB (ref 22–32)
CO2: 19 mmol/L — ABNORMAL LOW (ref 22–32)
CREATININE: 3.61 mg/dL — AB (ref 0.61–1.24)
Calcium: 7.8 mg/dL — ABNORMAL LOW (ref 8.9–10.3)
Chloride: 109 mmol/L (ref 101–111)
Chloride: 111 mmol/L (ref 101–111)
Creatinine, Ser: 3.43 mg/dL — ABNORMAL HIGH (ref 0.61–1.24)
GFR, EST AFRICAN AMERICAN: 20 mL/min — AB (ref >60)
GFR, EST AFRICAN AMERICAN: 18 mL/min — AB (ref >60)
GFR, EST NON AFRICAN AMERICAN: 17 mL/min — AB (ref >60)
GFR, EST NON AFRICAN AMERICAN: 16 mL/min — AB (ref >60)
Glucose, Bld: 101 mg/dL — ABNORMAL HIGH (ref 65–99)
Glucose, Bld: 122 mg/dL — ABNORMAL HIGH (ref 65–99)
POTASSIUM: 4.4 mmol/L (ref 3.5–5.1)
POTASSIUM: 4 mmol/L (ref 3.5–5.1)
SODIUM: 137 mmol/L (ref 135–145)
Sodium: 137 mmol/L (ref 135–145)

## 2016-07-10 LAB — GLUCOSE, CAPILLARY
GLUCOSE-CAPILLARY: 126 mg/dL — AB (ref 65–99)
GLUCOSE-CAPILLARY: 115 mg/dL — AB (ref 65–99)
GLUCOSE-CAPILLARY: 91 mg/dL (ref 65–99)
Glucose-Capillary: 126 mg/dL — ABNORMAL HIGH (ref 65–99)

## 2016-07-10 LAB — CALCIUM, IONIZED: CALCIUM, IONIZED, SERUM: 4.7 mg/dL (ref 4.5–5.6)

## 2016-07-10 MED ORDER — SODIUM CHLORIDE 0.9 % IV BOLUS (SEPSIS)
500.0000 mL | Freq: Once | INTRAVENOUS | Status: AC
  Administered 2016-07-10: 500 mL via INTRAVENOUS

## 2016-07-10 MED ORDER — FUROSEMIDE 10 MG/ML IJ SOLN
40.0000 mg | Freq: Once | INTRAMUSCULAR | Status: AC
  Administered 2016-07-10: 40 mg via INTRAVENOUS
  Filled 2016-07-10: qty 4

## 2016-07-10 MED ORDER — LABETALOL HCL 200 MG PO TABS
200.0000 mg | ORAL_TABLET | Freq: Three times a day (TID) | ORAL | Status: DC
  Administered 2016-07-10 – 2016-07-13 (×10): 200 mg via ORAL
  Filled 2016-07-10 (×10): qty 1

## 2016-07-10 NOTE — Progress Notes (Signed)
  Echocardiogram 2D Echocardiogram has been performed.  Delcie RochENNINGTON, Ellaree Gear 07/10/2016, 4:02 PM

## 2016-07-10 NOTE — Progress Notes (Signed)
eLink Physician-Brief Progress Note Patient Name: Mario RoyalsCarl Mccann DOB: April 03, 1946 MRN: [id number]   Date of Service  07/10/2016  HPI/Events of Note  Renal Failure - Creatinine = 3.43 >> 3.61. K+ = 4.0 and HCO3- = 19. No indication for acute dialysis.   eICU Interventions  Will order: 1. Bolus with 0.9 NaCl 500 mL IV over 30 minutes per Dr. Marchelle Gearingamaswamy 's sign over request.      Intervention Category Major Interventions: Other:  Lenell AntuSommer,Dakari Cregger Eugene 07/10/2016, 8:47 PM

## 2016-07-10 NOTE — Progress Notes (Signed)
PULMONARY / CRITICAL CARE MEDICINE   Name: Serafina RoyalsCarl Prosser MRN: [id number] DOB: 1946-03-10    ADMISSION DATE:  07/08/2016  REFERRING MD:  Dr. Rubin PayorPickering, ER  CHIEF COMPLAINT:  Back pain  HISTORY OF PRESENT ILLNESS:   70 yo male was eating dinner and then developed severe back pain.  It was painful for him to take a deep breath, and he felt anxious.  He came to ER.  He was noted to have BP 230/137.  His CXR showed tortuous aorta.  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  He lived in [place] and received medical care there.  [place] and was in process of getting medical care set up with [facility] - he has not been assigned a primary care provider yet.   SUBJECTIVE:  Pain still present  No distress.  Creatinine worse   VITAL SIGNS: BP 138/80 (BP Location: Left Arm)   Pulse 71   Temp 98.2 F (36.8 C) (Oral)   Resp (!) 26   Ht 6\' 1"  (1.854 m)   Wt 187 lb 9.8 oz (85.1 kg)   SpO2 100%   BMI 24.75 kg/m   INTAKE / OUTPUT: I/O last 3 completed shifts: In: 3395.8 [P.O.:960; I.V.:2265.8; IV Piggyback:170] Out: 1220 [Urine:1220]  PHYSICAL EXAMINATION: General:  Alert; no distress.  Neuro:  Normal strength, CN intact HEENT:  Pupils reactive, no stridor, no LAN Cardiovascular:  Regular, no murmur, mid line chest scar Lungs:  No wheeze/rales, no accessory use  Abdomen:  Soft, non tender, no organomegaly Musculoskeletal:  Mild ankle edema Skin: venous stasis changes  LABS:  BMET  Recent Labs Lab 07/09/16 0500 07/09/16 1100 07/10/16 0400  NA 139 136 137  K 3.2* 4.3 4.4  CL 117* 109 109  CO2 15* 22 21*  BUN 29* 37* 41*  CREATININE 2.38* 3.13* 3.43*  GLUCOSE 79 106* 101*    Electrolytes  Recent Labs Lab 07/09/16 0500 07/09/16 1100 07/10/16 0400  CALCIUM 6.1* 8.4* 8.2*    CBC  Recent Labs Lab 07/09/16 0500 07/09/16 1100 07/09/16 1700 07/10/16 0400  WBC 7.6  --  9.0 9.4   HGB 8.6* 9.5* 9.9* 9.4*  HCT 26.3* 28.5* 30.2* 28.6*  PLT 227  --  190 222    Coag's No results for input(s): APTT, INR in the last 168 hours.  Sepsis Markers No results for input(s): LATICACIDVEN, PROCALCITON, O2SATVEN in the last 168 hours.  ABG No results for input(s): PHART, PCO2ART, PO2ART in the last 168 hours.  Liver Enzymes No results for input(s): AST, ALT, ALKPHOS, BILITOT, ALBUMIN in the last 168 hours.  Cardiac Enzymes No results for input(s): TROPONINI, PROBNP in the last 168 hours.  Glucose  Recent Labs Lab 07/09/16 0042 07/09/16 0754 07/09/16 1139 07/09/16 1607 07/09/16 2205 07/10/16 0756  GLUCAP 121* 112* 121* 141* 150* 126*    Imaging No results found.   STUDIES:  MRA chest 11/03 >> type B aortic dissection from just beyond Lt subclavian throughout descending thoracic aorta ending about diaphragmatic hiatus, infrarenal AAA 5 cm, liver/renal cysts  CULTURES:  ANTIBIOTICS:  SIGNIFICANT EVENTS: 11/03 Admit 11/4 added nicardipine as esmolol maxed out. Asked vascular to see 11/5 esmolol stopped d/t nausea. BP not at goal. Creatinine worse. Changed BP goal to < 120. Added labetolol Cont nicardipine. Lasix given x 1.  LINES/TUBES:  DISCUSSION: 70 yo male with sudden onset of back pain from HTN emergency and type  B aortic dissection. Spoke w/ Dr Edilia Boickson. He will review imaging w/ Brabham on 11/5 and ask him to make recommendations on f/u imaging etc..Marland Kitchen.His esmolol was stopped d/t nausea. This has resolved but his creatinine is climbing. His BP is not at goal. He still has some pain. I think we need tighter BP control. Will add labetolol, give 1 X lasix, cont nicardipine. Change BP goal to <120.    ASSESSMENT / PLAN:  HTN emergency with type B aortic dissection. Hx of CAD s/p CABG, HTN, HLD. PAD with infrarenal AAA. - cont nicardipine -->titrate (goal SBP <120) - add oral labetolol (200 tid) - adding diuretic X 1 - goal SBP < 130, HR < 80 - f/u  Echo - vascular to make formal recs 11/6; he will need vascular f/u  DM type II. - SSI  CKD 3 - not sure what baseline renal fx is-->cr worse NAGMA in setting of hyperchloremia-->improved Hypokalemia  - tighter control of BP  - allow free water intake - monitor renal fx, urine outpt - replace K  - f/u am labs  Pain control. - prn analgesics  Nutrition. - adv diet   DVT prophylaxis - SCDs SUP - not indicated Goals of care - full code   Simonne MartinetPeter E Sou Nohr ACNP-BC [facility]Critical [facility] Pager # [phone] OR # [phone] if no answer

## 2016-07-11 ENCOUNTER — Inpatient Hospital Stay (HOSPITAL_COMMUNITY): Payer: Medicare Other

## 2016-07-11 DIAGNOSIS — I7101 Dissection of thoracic aorta: Secondary | ICD-10-CM

## 2016-07-11 DIAGNOSIS — M7989 Other specified soft tissue disorders: Secondary | ICD-10-CM

## 2016-07-11 LAB — COMPREHENSIVE METABOLIC PANEL
ALT: 11 U/L — ABNORMAL LOW (ref 17–63)
ANION GAP: 8 (ref 5–15)
AST: 11 U/L — ABNORMAL LOW (ref 15–41)
Albumin: 2.5 g/dL — ABNORMAL LOW (ref 3.5–5.0)
Alkaline Phosphatase: 49 U/L (ref 38–126)
BILIRUBIN TOTAL: 0.6 mg/dL (ref 0.3–1.2)
BUN: 42 mg/dL — ABNORMAL HIGH (ref 6–20)
CO2: 20 mmol/L — ABNORMAL LOW (ref 22–32)
Calcium: 7.7 mg/dL — ABNORMAL LOW (ref 8.9–10.3)
Chloride: 107 mmol/L (ref 101–111)
Creatinine, Ser: 3.75 mg/dL — ABNORMAL HIGH (ref 0.61–1.24)
GFR calc Af Amer: 18 mL/min — ABNORMAL LOW (ref >60)
GFR, EST NON AFRICAN AMERICAN: 15 mL/min — AB (ref >60)
Glucose, Bld: 103 mg/dL — ABNORMAL HIGH (ref 65–99)
POTASSIUM: 4 mmol/L (ref 3.5–5.1)
Sodium: 135 mmol/L (ref 135–145)
TOTAL PROTEIN: 5.9 g/dL — AB (ref 6.5–8.1)

## 2016-07-11 LAB — CBC
HEMATOCRIT: 26.5 % — AB (ref 39.0–52.0)
Hemoglobin: 8.7 g/dL — ABNORMAL LOW (ref 13.0–17.0)
MCH: 26.8 pg (ref 26.0–34.0)
MCHC: 32.8 g/dL (ref 30.0–36.0)
MCV: 81.5 fL (ref 78.0–100.0)
Platelets: 194 10*3/uL (ref 150–400)
RBC: 3.25 MIL/uL — ABNORMAL LOW (ref 4.22–5.81)
RDW: 15.2 % (ref 11.5–15.5)
WBC: 9.2 10*3/uL (ref 4.0–10.5)

## 2016-07-11 LAB — GLUCOSE, CAPILLARY
GLUCOSE-CAPILLARY: 91 mg/dL (ref 65–99)
GLUCOSE-CAPILLARY: 105 mg/dL — AB (ref 65–99)
GLUCOSE-CAPILLARY: 140 mg/dL — AB (ref 65–99)
Glucose-Capillary: 145 mg/dL — ABNORMAL HIGH (ref 65–99)

## 2016-07-11 LAB — URINE MICROSCOPIC-ADD ON

## 2016-07-11 LAB — URINALYSIS, ROUTINE W REFLEX MICROSCOPIC
BILIRUBIN URINE: NEGATIVE
Glucose, UA: NEGATIVE mg/dL
Ketones, ur: NEGATIVE mg/dL
NITRITE: NEGATIVE
PH: 5 (ref 5.0–8.0)
Protein, ur: NEGATIVE mg/dL
SPECIFIC GRAVITY, URINE: 1.019 (ref 1.005–1.030)

## 2016-07-11 LAB — SODIUM, URINE, RANDOM: Sodium, Ur: 20 mmol/L

## 2016-07-11 LAB — TROPONIN I
TROPONIN I: 0.11 ng/mL — AB (ref <0.03)
TROPONIN I: 0.09 ng/mL — AB (ref <0.03)
Troponin I: 0.07 ng/mL (ref <0.03)
Troponin I: 0.07 ng/mL (ref <0.03)

## 2016-07-11 LAB — PHOSPHORUS: PHOSPHORUS: 4.2 mg/dL (ref 2.5–4.6)

## 2016-07-11 LAB — LACTIC ACID, PLASMA: LACTIC ACID, VENOUS: 0.8 mmol/L (ref 0.5–1.9)

## 2016-07-11 LAB — OSMOLALITY, URINE: OSMOLALITY UR: 535 mosm/kg (ref 300–900)

## 2016-07-11 LAB — MAGNESIUM: MAGNESIUM: 1.6 mg/dL — AB (ref 1.7–2.4)

## 2016-07-11 MED ORDER — MAGNESIUM SULFATE 2 GM/50ML IV SOLN
2.0000 g | Freq: Once | INTRAVENOUS | Status: AC
  Administered 2016-07-11: 2 g via INTRAVENOUS
  Filled 2016-07-11: qty 50

## 2016-07-11 MED ORDER — HEPARIN (PORCINE) IN NACL 100-0.45 UNIT/ML-% IJ SOLN
1800.0000 [IU]/h | INTRAMUSCULAR | Status: DC
  Administered 2016-07-11: 1450 [IU]/h via INTRAVENOUS
  Administered 2016-07-12: 1700 [IU]/h via INTRAVENOUS
  Administered 2016-07-13 – 2016-07-17 (×7): 1800 [IU]/h via INTRAVENOUS
  Filled 2016-07-11 (×12): qty 250

## 2016-07-11 MED ORDER — POLYETHYLENE GLYCOL 3350 17 G PO PACK
17.0000 g | PACK | Freq: Every day | ORAL | Status: DC
  Administered 2016-07-11 – 2016-07-16 (×5): 17 g via ORAL
  Filled 2016-07-11 (×5): qty 1

## 2016-07-11 NOTE — Progress Notes (Signed)
Notified CCM of LLE DVT per vascular study, orders to be placed.  Hermina BartersBOWMAN, Bonnetta Allbee M, RN

## 2016-07-11 NOTE — Progress Notes (Signed)
ANTICOAGULATION CONSULT NOTE - Initial Consult  Pharmacy Consult for heparin Indication: DVT  No Known Allergies  Patient Measurements: Height: 6\' 1"  (185.4 cm) Weight: 192 lb 7.4 oz (87.3 kg) IBW/kg (Calculated) : 79.9 Heparin Dosing Weight: 87.3  Vital Signs: Temp: 98.1 F (36.7 C) (11/06 1557) Temp Source: Oral (11/06 1557) BP: 105/63 (11/06 1600) Pulse Rate: 66 (11/06 1600)  Labs:  Recent Labs  07/09/16 1700 07/10/16 0400 07/10/16 1610 07/11/16 0410 07/11/16 1100  HGB 9.9* 9.4*  --  8.7*  --   HCT 30.2* 28.6*  --  26.5*  --   PLT 190 222  --  194  --   CREATININE  --  3.43* 3.61* 3.75*  --   TROPONINI  --   --   --  0.11* 0.09*   Estimated Creatinine Clearance: 21 mL/min (by C-G formula based on SCr of 3.75 mg/dL (H)).  Medical History: Past Medical History:  Diagnosis Date  . Coronary artery disease   . Hypertension    Medications:  Prescriptions Prior to Admission  Medication Sig Dispense Refill Last Dose  . allopurinol (ZYLOPRIM) 100 MG tablet Take 100 mg by mouth daily.   unknown  . amLODipine (NORVASC) 10 MG tablet Take 10 mg by mouth daily.   unknown  . carvedilol (COREG) 25 MG tablet Take 25 mg by mouth 2 (two) times daily with a meal.   unknown  . Cholecalciferol (VITAMIN D) 2000 units tablet Take 2,000 Units by mouth daily.   unknown  . clopidogrel (PLAVIX) 75 MG tablet Take 75 mg by mouth.   unknown  . finasteride (PROSCAR) 5 MG tablet Take 5 mg by mouth daily.   unknown  . isosorbide mononitrate (IMDUR) 60 MG 24 hr tablet Take 60 mg by mouth at bedtime.   unknown  . losartan (COZAAR) 25 MG tablet Take 25 mg by mouth daily.   unknown  . oxybutynin (DITROPAN-XL) 10 MG 24 hr tablet Take 10 mg by mouth at bedtime.   unknown  . atorvastatin (LIPITOR) 80 MG tablet Take 80 mg by mouth daily.   unknown  . ezetimibe (ZETIA) 10 MG tablet Take 10 mg by mouth daily.   unknown  . ezetimibe-simvastatin (VYTORIN) 10-20 MG tablet Take 1 tablet by mouth daily.    unknown   Assessment: 70 yo male with uncontrolled hypertension and history of cardiac surgery and AAA repair (11/2015) found to have DVT in the left femoral vein. No anticoagulants prior to admission. SCr 3.25,  HgB 8.7 and Plt 194. Pharmacy has been asked to dose heparin. NO BOLUS per consult.  Goal of Therapy:  Heparin level 0.3-0.7 units/ml Monitor platelets by anticoagulation protocol: Yes   Plan:  Start heparin infusion at 1450 units/hr Check anti-Xa level in 8 hours and daily while on heparin Continue to monitor H&H and platelets  Nalany Steedley L Demya Scruggs 07/11/2016,4:38 PM

## 2016-07-11 NOTE — Progress Notes (Signed)
**  Preliminary report by tech**  Bilateral lower extremity venous duplex completed. There is evidence of age indeterminate vs chronic deep vein thrombosis involving the left femoral vein. The left popliteal vein appears non-compressible but is patent with color and spectral doppler. There is no evidence of superficial vein thrombosis involving the left lower extremity. There is no evidence of deep or superficial vein thrombosis involving the right lower extremity.  There is no evidence of a Baker's cyst bilaterally.  ARTERIAL  ABI completed: Right ABI of 0.64 is suggestive of moderate arterial occlusive disease at rest. Left ABI of 0.82 is suggestive of mild arterial occlusive disease at rest. Right TBI of 0.43 and left TBI of 0.38 are suggestive of abnormal arterial flow at rest.   RIGHT    LEFT    PRESSURE WAVEFORM  PRESSURE WAVEFORM  BRACHIAL 120 Triphasic BRACHIAL 115 Triphasic  DP 77 Monophasic DP 98 Biphasic  PT 75 Monophasic PT 91 Biphasic  GREAT TOE 52 NA GREAT TOE 46 NA    RIGHT LEFT  ABI 0.64 0.82      07/11/16 10:18 AM Olen CordialGreg Mykeal Carrick RVT

## 2016-07-11 NOTE — Progress Notes (Signed)
   VASCULAR SURGERY ASSESSMENT & PLAN:  TYPE B AORTIC DISSECTION: He needs a follow up CT of the abdomen and pelvis given that he had an endovascular aneurysm repair in [place] this March and has not had a follow up study. This will be done without contrast because of his renal insufficiency. I will also scan his chest given his dissection. I have discussed the case with Dr. Durene CalWells Brabham and we will make further recommendations concerning follow-up of his dissection pending his CT scan results.  STATUS POST ENDOVASCULAR ANEURYSM REPAIR IN [place]: According to the patient, he did not have a follow up CT scan after his endovascular aneurysm repair and therefore I will obtain this during this admission. I will do this without contrast given his renal insufficiency. Of note the endovascular aneurysm repair was on 11/23/2015. This was for a 5.4 cm infrarenal abdominal aortic aneurysm.  CHRONIC LEFT LOWER EXTREMITY SWELLING: Although this is chronic, I have ordered a venous duplex of the left lower extremity to rule out a DVT. This is pending.  PERIPHERAL VASCULAR DISEASE: I have ordered ABIs to have as a baseline.  SUBJECTIVE: No specific complaints.   PHYSICAL EXAM: Vitals:   07/11/16 0600 07/11/16 0630 07/11/16 0700 07/11/16 0727  BP: 126/77 117/71 122/74   Pulse: 78 79 77   Resp: (!) 27 (!) 27 (!) 23   Temp:    98.8 F (37.1 C)  TempSrc:    Oral  SpO2: 94% 96% 94%   Weight: 192 lb 7.4 oz (87.3 kg)     Height:       Both feet warm and well perfused.   mild left lower extremity swelling.  LABS:     Lab Results  Component Value Date   CREATININE 3.75 (H) 07/11/2016   Active Problems:   Aortic dissection, thoracic (HCC)   Renal insufficiency   AKI (acute kidney injury) [facility](HCC)  Mario CarawayChris Marcell Mccann Beeper: [phone]: [phone] 07/11/2016

## 2016-07-11 NOTE — Progress Notes (Signed)
CRITICAL VALUE ALERT  Critical value received:  Trop I = 0.11  Date of notification:  t  Time of notification:  0538  Critical value read back:Yes.    Nurse who received alert:  Almedia BallsJ. Cairo Lingenfelter, RN  MD notified (1st page): E-link MD  Time of first page:  [phone]  MD notified (2nd page):  Time of second page:  Responding MD:  Dr Jamison NeighborNestor  Time MD responded:  [phone]

## 2016-07-11 NOTE — Progress Notes (Signed)
eLink Physician-Brief Progress Note Patient Name: Mario RoyalsCarl Mccann DOB: 07-04-1946 MRN: [id number]   Date of Service  07/11/2016  HPI/Events of Note  Notified regarding single troponin I 0.11. Patient admitted with type B aortic dissection. Echocardiogram from 11/5 without focal wall motion abnormality. Elevation likely secondary to aortic dissection and doubt ischemia.   eICU Interventions  Trending troponin I every 6 hours      Intervention Category Intermediate Interventions: Other:  Lawanda CousinsJennings Mayvis Agudelo 07/11/2016, 5:58 AM

## 2016-07-11 NOTE — Progress Notes (Signed)
PULMONARY / CRITICAL CARE MEDICINE   Name: Mario RoyalsCarl Dancer MRN: [id number] DOB: [birth year]    ADMISSION DATE:  07/08/2016  REFERRING MD:  Dr. Rubin PayorPickering, ER  CHIEF COMPLAINT:  Back pain  HISTORY OF PRESENT ILLNESS:   70 yo male was eating dinner and then developed severe back pain.  It was painful for him to take a deep breath, and he felt anxious.  He came to ER.  He was noted to have BP 230/137.  His CXR showed tortuous aorta.  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 He has a history of cardiac surgery and AAA repair in the past.  He lived in [place] and received medical care there.  [place], and was in process of getting medical care set up with [facility] - he has not been assigned a primary care provider yet.  SUBJECTIVE: for CT scan  VITAL SIGNS: BP 122/74   Pulse 77   Temp 98.8 F (37.1 C) (Oral)   Resp (!) 23   Ht 6\' 1"  (1.854 m)   Wt 87.3 kg (192 lb 7.4 oz)   SpO2 94%   BMI 25.39 kg/m   HEMODYNAMICS:    VENTILATOR SETTINGS:    INTAKE / OUTPUT: I/O last 3 completed shifts: In: 3363.8 [P.O.:1060; I.V.:1803.8; IV Piggyback:500] Out: 1385 [Urine:1385]  PHYSICAL EXAMINATION: General:  Comfortable, NAD Neuro:  Awake and alert, CN II-VII grossly intact HEENT:  PEERL, EOMI, [place]/AT, no masses or thyromegaly palpable Cardiovascular:  RRR no mrg, moderate edema in lower extremities Lungs:  CTAB, no wheeze, eales or rhonchi Abdomen:  Soft, nontender, mildly distended Musculoskeletal:  Moves all extremities  Skin:  Warm, dry, intact  LABS:  BMET  Recent Labs Lab 07/10/16 0400 07/10/16 1610 07/11/16 0410  NA 137 137 135  K 4.4 4.0 4.0  CL 109 111 107  CO2 21* 19* 20*  BUN 41* 40* 42*  CREATININE 3.43* 3.61* 3.75*  GLUCOSE 101* 122* 103*    Electrolytes  Recent Labs Lab 07/10/16 0400 07/10/16 1610 07/11/16 0410  CALCIUM 8.2* 7.8* 7.7*  MG  --   --  1.6*  PHOS   --   --  4.2    CBC  Recent Labs Lab 07/09/16 1700 07/10/16 0400 07/11/16 0410  WBC 9.0 9.4 9.2  HGB 9.9* 9.4* 8.7*  HCT 30.2* 28.6* 26.5*  PLT 190 222 194    Coag's No results for input(s): APTT, INR in the last 168 hours.  Sepsis Markers  Recent Labs Lab 07/11/16 0500  LATICACIDVEN 0.8    ABG No results for input(s): PHART, PCO2ART, PO2ART in the last 168 hours.  Liver Enzymes  Recent Labs Lab 07/11/16 0410  AST 11*  ALT 11*  ALKPHOS 49  BILITOT 0.6  ALBUMIN 2.5*    Cardiac Enzymes  Recent Labs Lab 07/11/16 0410  TROPONINI 0.11*    Glucose  Recent Labs Lab 07/09/16 2205 07/10/16 0756 07/10/16 1123 07/10/16 1601 07/10/16 2219 07/11/16 0721  GLUCAP 150* 126* 126* 115* 91 91    Imaging No results found.   STUDIES:  MRA chest 11/03 >> type B aortic dissection from just beyond Lt subclavian throughout descending thoracic aorta ending about diaphragmatic hiatus, infrarenal AAA 5 cm, liver/renal cysts Echo 11/5 >> EF 55-60%, moderate LVH, no wall motion abnormalities, mildly calcified AV, mildy dilated ascending aorta at 3.7cm  CULTURES:  ANTIBIOTICS:  SIGNIFICANT EVENTS: 11/03 Admit 11/4 added nicardipine as  esmolol maxed out. Asked vascular to see 11/5 esmolol stopped d/t nausea. BP not at goal. Creatinine worse. Changed BP goal to < 120. Added labetolol Cont nicardipine. Lasix given x 1.   LINES/TUBES: 11/4 Art line  DISCUSSION: 70 yo male with sudden onset of back pain from HTN emergency and type B aortic dissection. He has a history of cardiac surgery and AAA repair in the past. Vascular surgery to reimage today. His creatinine is climbing. BP is at goal.   ASSESSMENT / PLAN:  PULMONARY A: No acute issues at this time P:   IS  CARDIOVASCULAR A:  HTN emergency with type B aortic dissection. Hx of CAD s/p CABG, HTN, HLD. PAD with infrarenal AAA. P:  - cont nicardipine --> titrate (goal SBP <120) - labetolol 200  tid - goal SBP < 120, HR < 80 - Echo shows EF 55-60%, moderate LVH, no wall motion abnormalities, mildly calcified AV, mildy dilated ascending aorta at 3.7cm - vascular to reimage today  RENAL A:   CKD 3 - not sure what baseline renal fx is-->cr rising today NAGMA in setting of hyperchloremia-->improved Hypokalemia  Hypomagnesemia  P:   - continue tight control of BP  - monitor renal fx, urine outpt 1 L last 24 hr - replace elytes as needed - CMP in AM  GASTROINTESTINAL A:   No acute issues at this time P:   Heart healthy diet  HEMATOLOGIC A:   Anemia DVT ppx P:  SSD CBC in AM  INFECTIOUS A:   No acute issues at this time P:    ENDOCRINE A:   DM type II.   P:   SSI  NEUROLOGIC A:   Awake and alert P:    FAMILY  - Updates: no family at bedside at this time  - Inter-disciplinary family meet or Palliative Care meeting due by:  07/15/16  Ephriam JenkinsPaige Ganem, MS 4 Attending: Dr. Tyson AliasFeinstein   Pulmonary and Critical Care Medicine [facility] Pager: [phone]  07/11/2016, 7:51 AM  STAFF NOTE: I, Rory Percyaniel Janaa Acero, MD FACP have personally reviewed patient's available data, including medical history, events of note, physical examination and test results as part of my evaluation. I have discussed with resident/NP and other care providers such as pharmacist, RN and RRT. In addition, I personally evaluated patient and elicited key findings of: awake, alert, neuro examination wnl, BP at goals, mild abdo distention, obese, equal pulses uppers and lowers, ARF crt noted, likely he is hypervolemic and 3rd spacing vs euvolemic, crt rising, ATN likely , no dissection renals noted on MRI, for CT now per surgery, continued nicardipine as we load labetalol, no role nipride with arf crt, will assess for hydro on CT done today, assess volume status , may need cvp, needs chem q12h, may  Need further lasix - assess todays values sending prior, hard to blame dissection for trop,  repeat in am, I have reviewed vasc recs, appreciate help, keep with sys goals 110-120 The patient is critically ill with multiple organ systems failure and requires high complexity decision making for assessment and support, frequent evaluation and titration of therapies, application of advanced monitoring technologies and extensive interpretation of multiple databases.   Critical Care Time devoted to patient care services described in this note is 30  Minutes. This time reflects time of care of this signee: Rory Percyaniel Tyland Klemens, MD FACP. This critical care time does not reflect procedure time, or teaching time or supervisory time of PA/NP/Med student/Med Resident etc but could  involve care discussion time. Rest per NP/medical resident whose note is outlined above and that I agree with   Mcarthur Rossetti. Tyson Alias, MD, FACP Pgr: [phone] [place] [facility] 07/11/2016 10:03 AM

## 2016-07-12 LAB — COMPREHENSIVE METABOLIC PANEL
ALBUMIN: 2.4 g/dL — AB (ref 3.5–5.0)
ALT: 9 U/L — AB (ref 17–63)
AST: 12 U/L — AB (ref 15–41)
Alkaline Phosphatase: 52 U/L (ref 38–126)
Anion gap: 7 (ref 5–15)
BUN: 48 mg/dL — AB (ref 6–20)
CHLORIDE: 109 mmol/L (ref 101–111)
CO2: 19 mmol/L — AB (ref 22–32)
CREATININE: 3.72 mg/dL — AB (ref 0.61–1.24)
Calcium: 7.9 mg/dL — ABNORMAL LOW (ref 8.9–10.3)
GFR calc Af Amer: 18 mL/min — ABNORMAL LOW (ref >60)
GFR calc non Af Amer: 15 mL/min — ABNORMAL LOW (ref >60)
GLUCOSE: 98 mg/dL (ref 65–99)
POTASSIUM: 4.4 mmol/L (ref 3.5–5.1)
SODIUM: 135 mmol/L (ref 135–145)
Total Bilirubin: 0.4 mg/dL (ref 0.3–1.2)
Total Protein: 6.2 g/dL — ABNORMAL LOW (ref 6.5–8.1)

## 2016-07-12 LAB — GLUCOSE, CAPILLARY
GLUCOSE-CAPILLARY: 133 mg/dL — AB (ref 65–99)
GLUCOSE-CAPILLARY: 138 mg/dL — AB (ref 65–99)
Glucose-Capillary: 108 mg/dL — ABNORMAL HIGH (ref 65–99)
Glucose-Capillary: 132 mg/dL — ABNORMAL HIGH (ref 65–99)

## 2016-07-12 LAB — CBC
HCT: 26.4 % — ABNORMAL LOW (ref 39.0–52.0)
Hemoglobin: 8.7 g/dL — ABNORMAL LOW (ref 13.0–17.0)
MCH: 26.8 pg (ref 26.0–34.0)
MCHC: 33 g/dL (ref 30.0–36.0)
MCV: 81.2 fL (ref 78.0–100.0)
PLATELETS: 211 10*3/uL (ref 150–400)
RBC: 3.25 MIL/uL — AB (ref 4.22–5.81)
RDW: 15 % (ref 11.5–15.5)
WBC: 9.4 10*3/uL (ref 4.0–10.5)

## 2016-07-12 LAB — PHOSPHORUS: PHOSPHORUS: 4.2 mg/dL (ref 2.5–4.6)

## 2016-07-12 LAB — HEPARIN LEVEL (UNFRACTIONATED)
HEPARIN UNFRACTIONATED: 0.42 [IU]/mL (ref 0.30–0.70)
Heparin Unfractionated: 0.14 IU/mL — ABNORMAL LOW (ref 0.30–0.70)
Heparin Unfractionated: 0.3 IU/mL (ref 0.30–0.70)

## 2016-07-12 LAB — MAGNESIUM: MAGNESIUM: 2.1 mg/dL (ref 1.7–2.4)

## 2016-07-12 MED ORDER — SODIUM CHLORIDE 0.9 % IV SOLN
INTRAVENOUS | Status: DC
  Administered 2016-07-12 – 2016-07-13 (×2): via INTRAVENOUS

## 2016-07-12 MED ORDER — HYDRALAZINE HCL 20 MG/ML IJ SOLN
10.0000 mg | INTRAMUSCULAR | Status: DC | PRN
  Administered 2016-07-12: 10 mg via INTRAVENOUS
  Filled 2016-07-12: qty 1

## 2016-07-12 MED ORDER — AMLODIPINE BESYLATE 10 MG PO TABS
10.0000 mg | ORAL_TABLET | Freq: Every day | ORAL | Status: DC
  Administered 2016-07-12 – 2016-07-13 (×2): 10 mg via ORAL
  Filled 2016-07-12 (×2): qty 1

## 2016-07-12 MED ORDER — BISACODYL 5 MG PO TBEC
10.0000 mg | DELAYED_RELEASE_TABLET | Freq: Every day | ORAL | Status: DC | PRN
  Administered 2016-07-17: 10 mg via ORAL
  Filled 2016-07-12: qty 2

## 2016-07-12 MED ORDER — ISOSORBIDE MONONITRATE ER 30 MG PO TB24
60.0000 mg | ORAL_TABLET | Freq: Every day | ORAL | Status: DC
  Administered 2016-07-12: 60 mg via ORAL
  Filled 2016-07-12: qty 2

## 2016-07-12 NOTE — Care Management Note (Addendum)
Case Management Note  Patient Details  Name: Mario RoyalsCarl Klontz MRN: [id number] Date of Birth: 1945-09-18  Subjective/Objective:         Admitted with back pain fround to have type B aortic dissection           Action/Plan:  PTA independent from home with wife and daughters.  [place] recently [place] from [place] and [place] [facility] [place] [facility] [place] [facility] [place] [facility].  CM request pt to have daughter bring VA contact information to CM so she can assist in locating a P[facility] at the local [facility].  CM will continue to monitor for discharge needs   Expected Discharge Date:                  Expected Discharge Plan:  Home/Self Care  In-House Referral:     Discharge planning Services  CM Consult  Post Acute Care Choice:    Choice offered to:     DME Arranged:    DME Agency:     HH Arranged:    HH Agency:     Status of Service:  In process, will continue to follow  If discussed at Long Length of Stay Meetings, dates discussed:    Additional Comments: 07/12/2016 CM received needed information from pt/daughter.  CM [facility] and set up initial appt with PCP Jamal MaesYee Simmons 07/22/16 at 12:20 at the [facility] [address], [place], [place] [id number].  CM provided information to pt/family directly and provided on AVS.   CM also provided [facility] card - VA should be primary and BCBS secondary  Cherylann ParrClaxton, Daejah Klebba S, RN 07/12/2016, 10:32 AM

## 2016-07-12 NOTE — Progress Notes (Signed)
PULMONARY / CRITICAL CARE MEDICINE   Name: Mario Mccann MRN: [id number] DOB: Jun 17, 1946    ADMISSION DATE:  07/08/2016 REFERRING MD:Dr. Rubin PayorPickering, ER  CHIEF COMPLAINT:Back pain  HISTORY OF PRESENT ILLNESS: 70 yo male was eating dinner and then developed severe back pain. It was painful for him to take a deep breath, and he felt anxious. He came to ER. He was noted to have BP 230/137. His CXR showed tortuous aorta. 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 He has a history of cardiac surgery and AAA repair in the past.  He lived in [place] and received medical care there. [place] recently [place] [facility] [place], and was in process of getting medical care set up with [facility] - he has not been assigned a primary care provider yet.  SUBJECTIVE: No acute events overnight. Pt resting comfortably in bed. Denies chest pain. He has swelling in his right hand that began after art line was removed yesterday. Will continue to monitor. Vascular has no plans for surgery at this time.   VITAL SIGNS: BP (!) 124/94   Pulse 72   Temp 98.5 F (36.9 C) (Oral)   Resp (!) 25   Ht 6\' 1"  (1.854 m)   Wt 95.9 kg (211 lb 6.7 oz)   SpO2 93%   BMI 27.89 kg/m   HEMODYNAMICS:    VENTILATOR SETTINGS:    INTAKE / OUTPUT: I/O last 3 completed shifts: In: 2144.6 [P.O.:420; I.V.:1224.6; IV Piggyback:500] Out: 1730 [Urine:1730]  PHYSICAL EXAMINATION: General:  NAD, comfortable Neuro:  Awake, alert, CN II-VII grossly intact HEENT:  [place], [place]/AT Cardiovascular:  rrr no mrg, mild LE edema Lungs:  CTAB, symmetric chest wall motion Abdomen:  Soft, nontender, bowel sounds present Musculoskeletal:  Moves all 4 extremities Skin:  Warm, dry, no bruising or bleeding  LABS:  BMET  Recent Labs Lab 07/10/16 1610 07/11/16 0410 07/12/16 0116  NA 137 135 135  K 4.0 4.0 4.4  CL 111 107 109  CO2 19* 20* 19*  BUN 40* 42* 48*  CREATININE  3.61* 3.75* 3.72*  GLUCOSE 122* 103* 98    Electrolytes  Recent Labs Lab 07/10/16 1610 07/11/16 0410 07/12/16 0116  CALCIUM 7.8* 7.7* 7.9*  MG  --  1.6* 2.1  PHOS  --  4.2 4.2    CBC  Recent Labs Lab 07/10/16 0400 07/11/16 0410 07/12/16 0116  WBC 9.4 9.2 9.4  HGB 9.4* 8.7* 8.7*  HCT 28.6* 26.5* 26.4*  PLT 222 194 211    Coag's No results for input(s): APTT, INR in the last 168 hours.  Sepsis Markers  Recent Labs Lab 07/11/16 0500  LATICACIDVEN 0.8    ABG No results for input(s): PHART, PCO2ART, PO2ART in the last 168 hours.  Liver Enzymes  Recent Labs Lab 07/11/16 0410 07/12/16 0116  AST 11* 12*  ALT 11* 9*  ALKPHOS 49 52  BILITOT 0.6 0.4  ALBUMIN 2.5* 2.4*    Cardiac Enzymes  Recent Labs Lab 07/11/16 1100 07/11/16 1606 07/11/16 2218  TROPONINI 0.09* 0.07* 0.07*    Glucose  Recent Labs Lab 07/10/16 2219 07/11/16 0721 07/11/16 1258 07/11/16 1556 07/11/16 2123 07/12/16 0737  GLUCAP 91 91 105* 145* 140* 108*    Imaging Ct Abdomen Pelvis Wo Contrast  Result Date: 07/11/2016 CLINICAL DATA:  70 year old male recently diagnosed with abdominal aortic aneurysm. Followup study. EXAM: CT CHEST, ABDOMEN AND PELVIS WITHOUT CONTRAST TECHNIQUE: Multidetector CT imaging of the chest, abdomen  and pelvis was performed following the standard protocol without IV contrast. COMPARISON:  No priors. MRI of the thorax without gadolinium 07/08/2016. FINDINGS: CT CHEST FINDINGS Cardiovascular: Heart size is borderline enlarged. There is no significant pericardial fluid, thickening or pericardial calcification. There is aortic atherosclerosis, as well as atherosclerosis of the great vessels of the mediastinum and the coronary arteries, including calcified atherosclerotic plaque in the left main, left anterior descending, left circumflex and right coronary arteries. Status post median sternotomy for CABG, including LIMA to the LAD. Throughout much of the  descending thoracic aorta there is crescentic high attenuation associated with the posterior and left lateral wall of the descending thoracic aorta. This is poorly evaluated on today's noncontrast examination. Mediastinum/Nodes: No pathologically enlarged mediastinal or hilar lymph nodes. Please note that accurate exclusion of hilar adenopathy is limited on noncontrast CT scans. Esophagus is unremarkable in appearance. No axillary lymphadenopathy. Lungs/Pleura: Moderate centrilobular and mild paraseptal emphysema. Small bilateral low-attenuation pleural effusions layer dependently. Some associated passive subsegmental atelectasis in the lower lobes of the lungs bilaterally. No definite acute consolidative airspace disease. No definite suspicious appearing pulmonary nodules or masses. Musculoskeletal: Median sternotomy wires. There are no aggressive appearing lytic or blastic lesions noted in the visualized portions of the skeleton. CT ABDOMEN PELVIS FINDINGS Hepatobiliary: There are innumerable low-attenuation lesions scattered throughout the liver, incompletely characterized on today's noncontrast CT examination, but statistically likely multiple cysts. Gallbladder appears decompressed. Pancreas: No definite pancreatic mass or peripancreatic inflammatory changes on today's noncontrast CT examination. Spleen: Unremarkable. Adrenals/Urinary Tract: Throughout both kidneys there are innumerable low, intermediate and high attenuation lesions which are incompletely characterized on today's noncontrast CT examination, but presumably represent a combination of simple cysts and complex cysts, presumably in the setting of polycystic kidney disease. Some of these cysts are located in the parapelvic regions. No hydroureteronephrosis. Urinary bladder is nearly completely decompressed around an indwelling Foley catheter, with a small amount of gas non dependently in the lumen of the urinary bladder. Adrenal glands are  unremarkable in appearance bilaterally. Stomach/Bowel: Unenhanced appearance of the stomach is normal. There is no pathologic dilatation of small bowel or colon. The appendix is not confidently identified and may be surgically absent. Regardless, there are no inflammatory changes noted adjacent to the cecum to suggest the presence of an acute appendicitis at this time. Vascular/Lymphatic: Fusiform infrarenal abdominal aortic aneurysm measuring up to 5.9 x 4.9 cm (axial image 208 of series 7). 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 The patency of these grafts cannot be assessed on today's noncontrast examination, nor can accurate assessment for endoleak be assessed. There is also aneurysmal dilatation of the common iliac arteries bilaterally which measure up to 3.4 cm in diameter on the right and 2.1 cm in diameter on the left. No lymphadenopathy noted in the abdomen or pelvis on today's noncontrast CT examination. Reproductive: Prostate gland and seminal vesicles are unremarkable in appearance. Other: Small volume of ascites predominantly in the low anatomic pelvis and in the left paracolic gutter. No pneumoperitoneum. Musculoskeletal: There are no aggressive appearing lytic or blastic lesions noted in the visualized portions of the skeleton. IMPRESSION: 1. Fusiform infrarenal abdominal aortic aneurysm measuring up to 5.9 x 4.9 cm. Vascular surgery consultation recommended (if not are the performed) due to increased risk of rupture for AAA >5.5 cm. This recommendation follows ACR consensus guidelines: White Paper of the ACR Incidental Findings Committee II on Vascular Findings. J Am Coll Radiol 2013; 10:789-794. 2. Recently recognized type B  dissection is not well demonstrated on today's noncontrast CT examination. However, there is considerable crescentic high attenuation associated with the posterior and left lateral wall of the descending thoracic aorta in the distribution of the recently  recognized type B dissection. This is favored to represent extensive partial thrombosis of the false lumen rather than evidence of recent development of intramural hematoma. 3. Stigmata of autosomal dominant polycystic kidney disease with innumerable cystic lesions in the kidneys and liver, as above. 4. Small volume of ascites. 5. Additional incidental findings, as above. Electronically Signed   By: Trudie Reedaniel  Entrikin M.D.   On: 07/11/2016 11:20   Ct Chest Without Contrast  Result Date: 07/11/2016 CLINICAL DATA:  70 year old male recently diagnosed with abdominal aortic aneurysm. Followup study. EXAM: CT CHEST, ABDOMEN AND PELVIS WITHOUT CONTRAST TECHNIQUE: Multidetector CT imaging of the chest, abdomen and pelvis was performed following the standard protocol without IV contrast. COMPARISON:  No priors. MRI of the thorax without gadolinium 07/08/2016. FINDINGS: CT CHEST FINDINGS Cardiovascular: Heart size is borderline enlarged. There is no significant pericardial fluid, thickening or pericardial calcification. There is aortic atherosclerosis, as well as atherosclerosis of the great vessels of the mediastinum and the coronary arteries, including calcified atherosclerotic plaque in the left main, left anterior descending, left circumflex and right coronary arteries. Status post median sternotomy for CABG, including LIMA to the LAD. Throughout much of the descending thoracic aorta there is crescentic high attenuation associated with the posterior and left lateral wall of the descending thoracic aorta. This is poorly evaluated on today's noncontrast examination. Mediastinum/Nodes: No pathologically enlarged mediastinal or hilar lymph nodes. Please note that accurate exclusion of hilar adenopathy is limited on noncontrast CT scans. Esophagus is unremarkable in appearance. No axillary lymphadenopathy. Lungs/Pleura: Moderate centrilobular and mild paraseptal emphysema. Small bilateral low-attenuation pleural effusions  layer dependently. Some associated passive subsegmental atelectasis in the lower lobes of the lungs bilaterally. No definite acute consolidative airspace disease. No definite suspicious appearing pulmonary nodules or masses. Musculoskeletal: Median sternotomy wires. There are no aggressive appearing lytic or blastic lesions noted in the visualized portions of the skeleton. CT ABDOMEN PELVIS FINDINGS Hepatobiliary: There are innumerable low-attenuation lesions scattered throughout the liver, incompletely characterized on today's noncontrast CT examination, but statistically likely multiple cysts. Gallbladder appears decompressed. Pancreas: No definite pancreatic mass or peripancreatic inflammatory changes on today's noncontrast CT examination. Spleen: Unremarkable. Adrenals/Urinary Tract: Throughout both kidneys there are innumerable low, intermediate and high attenuation lesions which are incompletely characterized on today's noncontrast CT examination, but presumably represent a combination of simple cysts and complex cysts, presumably in the setting of polycystic kidney disease. Some of these cysts are located in the parapelvic regions. No hydroureteronephrosis. Urinary bladder is nearly completely decompressed around an indwelling Foley catheter, with a small amount of gas non dependently in the lumen of the urinary bladder. Adrenal glands are unremarkable in appearance bilaterally. Stomach/Bowel: Unenhanced appearance of the stomach is normal. There is no pathologic dilatation of small bowel or colon. The appendix is not confidently identified and may be surgically absent. Regardless, there are no inflammatory changes noted adjacent to the cecum to suggest the presence of an acute appendicitis at this time. Vascular/Lymphatic: Fusiform infrarenal abdominal aortic aneurysm measuring up to 5.9 x 4.9 cm (axial image 208 of series 7). 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 The patency of  these grafts cannot be assessed on today's noncontrast examination, nor can accurate assessment for endoleak be assessed. There is also aneurysmal dilatation of the  common iliac arteries bilaterally which measure up to 3.4 cm in diameter on the right and 2.1 cm in diameter on the left. No lymphadenopathy noted in the abdomen or pelvis on today's noncontrast CT examination. Reproductive: Prostate gland and seminal vesicles are unremarkable in appearance. Other: Small volume of ascites predominantly in the low anatomic pelvis and in the left paracolic gutter. No pneumoperitoneum. Musculoskeletal: There are no aggressive appearing lytic or blastic lesions noted in the visualized portions of the skeleton. IMPRESSION: 1. Fusiform infrarenal abdominal aortic aneurysm measuring up to 5.9 x 4.9 cm. Vascular surgery consultation recommended (if not are the performed) due to increased risk of rupture for AAA >5.5 cm. This recommendation follows ACR consensus guidelines: White Paper of the ACR Incidental Findings Committee II on Vascular Findings. J Am Coll Radiol 2013; 10:789-794. 2. Recently recognized type B dissection is not well demonstrated on today's noncontrast CT examination. However, there is considerable crescentic high attenuation associated with the posterior and left lateral wall of the descending thoracic aorta in the distribution of the recently recognized type B dissection. This is favored to represent extensive partial thrombosis of the false lumen rather than evidence of recent development of intramural hematoma. 3. Stigmata of autosomal dominant polycystic kidney disease with innumerable cystic lesions in the kidneys and liver, as above. 4. Small volume of ascites. 5. Additional incidental findings, as above. Electronically Signed   By: Trudie Reed M.D.   On: 07/11/2016 11:20    STUDIES: MRA chest 11/03 >> type B aortic dissection from just beyond Lt subclavian throughout descending thoracic aorta  ending about diaphragmatic hiatus, infrarenal AAA 5 cm, liver/renal cysts Echo 11/5 >> EF 55-60%, moderate LVH, no wall motion abnormalities, mildly calcified AV, mildy dilated ascending aorta at 3.7cm CT chest/abd 11/6 >> - Fusiform infrarenal abdominal aortic aneurysm measuring up to 5.9 x 4.9 cm. - Stigmata of autosomal dominant polycystic kidney disease with innumerable cystic lesions in the kidneys and liver - Small volume ascites Doppler 11/6 >>deep vein thrombosis involving the left femoral vein  CULTURES:  ANTIBIOTICS:  SIGNIFICANT EVENTS: 11/03 Admit 11/4 added nicardipine as esmolol maxed out. Asked vascular to see 11/5 esmolol stopped d/t nausea. BP not at goal. Creatinine worse. Changed BP goal to <120. Added labetolol Cont nicardipine. Lasix given x 1. 11/6 DVT identified and anticoagulation started   LINES/TUBES: 11/4 Art line >> removed 11/6  DISCUSSION: 70 yo male with sudden onset of back pain from HTN emergency and type B aortic dissection. He has a history of cardiac surgery and AAA repair in the past. Vascular surgery to reimage today. His creatinine is climbing. BP is at goal.   ASSESSMENT / PLAN:  PULMONARY A: No acute issues at this time P:   IS Out of bed to chair okay when therapeutic  CARDIOVASCULAR A:  HTN emergency with type B aortic dissection. Hx of CAD s/p CABG, HTN, HLD. PAD with infrarenal AAA. DVT left femoral vein P:  - cont nicardipine --> titrate (goal SBP <120)- goal is to wean to off - labetolol 200 tid -add oral home meds - goal SBP < 120, HR < 80 - Echo shows EF 55-60%, moderate LVH, no wall motion abnormalities, mildly calcified AV, mildy dilated ascending aorta at 3.7cm -on heparin, pharmacy to dose - vascular following  RENAL A:   CKD 3 - not sure what baseline renal fx is-->cr stabletoday- plat of crt noted NAGMA in setting of hyperchloremia-->improved Hypokalemia --> improved Hypomagnesemia -->  improved Polycystic kidney  disease? 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   P:   SSI  NEUROLOGIC A:   Awake and alert P:   Mobilize soon  FAMILY  - Updates: no family at bedside at this time  - Inter-disciplinary family meet or Palliative Care meeting due by:  07/15/16  Ephriam Jenkins, MS 4 Attending: Dr. Tyson Alias   Pulmonary and Critical Care Medicine [facility] Pager: [phone]  07/12/2016, 8:13 AM   STAFF NOTE: I, Rory Percy, MD FACP have personally reviewed patient's available data, including medical history, events of note, physical examination and test results as part of my evaluation. I have discussed with resident/NP and other care providers such as pharmacist, RN and RRT. In addition, I personally evaluated patient and elicited key findings of: awake, alert, BP controlled on drip, nonfocal, HR wnl, abdo soft, all CT reviewed, heparin started for DVT, goals today is to oral increase HTn meds and wean drip to off, add home norvasc, nitrate, assess reponse, likely will need to change labvetolol to home coreg tyoday also and add hydral if HR remains on low side, crt seemed to plat, urine studies ( although accuracy>? In aRF) show hypovolemia, he does not appear overloaded on examination, will add saline at 75, follow trend, NO lasix, will consult renal now given the extensive polycystic dz associated, obtain  renal US with duplex, his repair AAA seemed infra renal, heparin to continue, will ambulate in am once fully therapeutric The patient is critically ill with multiple organ systems failure and requires high complexity decision making for assessment and support, frequent evaluation and titration of therapies, application of advanced monitoring technologies and extensive interpretation of multiple databases.   Critical Care Time devoted to patient care services described in this note is 30 Minutes. This time reflects time of care of this signee: Rory Percy, MD FACP. This critical care time does not reflect procedure time, or teaching time or supervisory time of PA/NP/Med student/Med Resident etc but could involve care discussion time. Rest per NP/medical resident whose note is outlined above and that I agree with   Mcarthur Rossetti. Tyson Alias, MD, FACP Pgr: [phone] [facility] 07/12/2016 8:47 AM

## 2016-07-12 NOTE — Progress Notes (Signed)
ANTICOAGULATION CONSULT NOTE - Follow Up Consult  Pharmacy Consult for heparin Indication: DVT  Labs:  Recent Labs  07/09/16 1700 07/10/16 0400 07/10/16 1610  07/11/16 0410 07/11/16 1100 07/11/16 1606 07/11/16 2218 07/12/16 0116  HGB 9.9* 9.4*  --   --  8.7*  --   --   --   --   HCT 30.2* 28.6*  --   --  26.5*  --   --   --   --   PLT 190 222  --   --  194  --   --   --   --   HEPARINUNFRC  --   --   --   --   --   --   --   --  0.14*  CREATININE  --  3.43* 3.61*  --  3.75*  --   --   --   --   TROPONINI  --   --   --   < > 0.11* 0.09* 0.07* 0.07*  --   < > = values in this interval not displayed.   Assessment: 70yo male subtherapeutic on heparin with initial dosing for new DVT.  Goal of Therapy:  Heparin level 0.3-0.7 units/ml   Plan:  Will increase heparin gtt by 3 units/kg/hr to 1700 units/hr and check level in 8hr.  Vernard GamblesVeronda Farhad Burleson, PharmD, BCPS  07/12/2016,2:24 AM

## 2016-07-12 NOTE — Progress Notes (Signed)
ANTICOAGULATION CONSULT NOTE - Follow Up Consult  Pharmacy Consult for heparin Indication: DVT  Labs:  Recent Labs  07/10/16 0400 07/10/16 1610  07/11/16 0410 07/11/16 1100 07/11/16 1606 07/11/16 2218 07/12/16 0116 07/12/16 1148 07/12/16 2109  HGB 9.4*  --   --  8.7*  --   --   --  8.7*  --   --   HCT 28.6*  --   --  26.5*  --   --   --  26.4*  --   --   PLT 222  --   --  194  --   --   --  211  --   --   HEPARINUNFRC  --   --   --   --   --   --   --  0.14* 0.42 0.30  CREATININE 3.43* 3.61*  --  3.75*  --   --   --  3.72*  --   --   TROPONINI  --   --   < > 0.11* 0.09* 0.07* 0.07*  --   --   --   < > = values in this interval not displayed.  Assessment: 70yo male found to have DVT in the left femoral vein. No anticoagulants prior to admission. Heparin level 0.30 on 1700 units/hr. Hgb low but stable, no bleeding noted. Will increase infusion slightly to maintain therapeutic level.  Goal of Therapy:  Heparin level 0.3-0.7 units/ml Monitor platelets by anticoagulation protocol: Yes   Plan:  Increase heparin to 1800 units/hr Daily heparin level/CBC Monitor s/sx of bleeding  Ruben Imony Macintyre Alexa, PharmD Clinical Pharmacist Pager: [phone] 07/12/2016 10:28 PM

## 2016-07-12 NOTE — Care Management Important Message (Signed)
Important Message  Patient Details  Name: Mario Mccann MRN: [id number] Date of Birth: [birth year]   Medicare Important Message Given:  Yes    Kyla BalzarineShealy, Genita Nilsson [place] 07/12/2016, 9:16 AM

## 2016-07-12 NOTE — Progress Notes (Signed)
   VASCULAR SURGERY ASSESSMENT & PLAN:  TYPE B THORACIC AORTIC DISSECTION: The CT scan had to be done without contrast because of his renal insufficiency. Thus this did not shed much light on his aortic dissection. I will discuss timing of follow up with Dr. Myra GianottiBrabham.   STATUS POST ENDOVASCULAR ANEURYSM REPAIR IN [place]: Based on my review of the CT scan, the stent graft appears to be in good position. The aneurysm measures 5 my measurement approximately 5.5 cm in maximum diameter. I will order a follow up duplex in 6 months in my office.  CHRONIC LEFT LOWER EXTREMITY SWELLING: His venous duplex scan showed DVT in the left common femoral vein which may be chronic. As it is really not possible to determine how long he has had this clot I would recommend 3 months of anticoagulation and a follow up study at that time. I will arrange for the follow up study.  PERIPHERAL VASCULAR DISEASE: Given that he had evidence of peripheral vascular disease on exam and he was new to our care I did order ABIs to have as a baseline. His ABI on the right is 64%. ABI on the left is 82%. I will get a follow up study in 6 months when he comes back for his duplex of his EVAR.  SUBJECTIVE: No specific complaints.  PHYSICAL EXAM: Vitals:   07/12/16 1530 07/12/16 1545 07/12/16 1600 07/12/16 1615  BP: 130/70 132/79 124/72 123/72  Pulse: 68 68 68 63  Resp: (!) 24 (!) 28 (!) 24 19  Temp:      TempSrc:      SpO2: 95% 95% 94% 93%  Weight:      Height:       Mild left lower extremity swelling.  LABS: Lab Results  Component Value Date   WBC 9.4 07/12/2016   HGB 8.7 (L) 07/12/2016   HCT 26.4 (L) 07/12/2016   MCV 81.2 07/12/2016   PLT 211 07/12/2016   Lab Results  Component Value Date   CREATININE 3.72 (H) 07/12/2016   No results found for: INR, PROTIME CBG (last 3)   Recent Labs  07/12/16 0737 07/12/16 1125 07/12/16 1525  GLUCAP 108* 133* 138*    Active Problems:   Thoracic aortic dissection  (HCC)   Renal insufficiency   AKI (acute kidney injury) (HCC)  Mario Mccann Beeper: [phone] 07/12/2016

## 2016-07-12 NOTE — Progress Notes (Signed)
ANTICOAGULATION CONSULT NOTE - Follow Up Consult  Pharmacy Consult for heparin Indication: DVT  Labs:  Recent Labs  07/10/16 0400 07/10/16 1610  07/11/16 0410 07/11/16 1100 07/11/16 1606 07/11/16 2218 07/12/16 0116 07/12/16 1148  HGB 9.4*  --   --  8.7*  --   --   --  8.7*  --   HCT 28.6*  --   --  26.5*  --   --   --  26.4*  --   PLT 222  --   --  194  --   --   --  211  --   HEPARINUNFRC  --   --   --   --   --   --   --  0.14* 0.42  CREATININE 3.43* 3.61*  --  3.75*  --   --   --  3.72*  --   TROPONINI  --   --   < > 0.11* 0.09* 0.07* 0.07*  --   --   < > = values in this interval not displayed.   Assessment: 70yo male now therapeutic on heparin with dosing for new DVT. Heparin level 0.4 on 1700 units/hr. No changes for now. Hgb low but stable, no bleeding noted.   Goal of Therapy:  Heparin level 0.3-0.7 units/ml   Plan:  Continue heparin at 1700/hr Check confirmatory heparin level tonight  Sheppard CoilFrank Wilson PharmD., BCPS Clinical Pharmacist Pager [phone] 07/12/2016 1:16 PM

## 2016-07-12 NOTE — Progress Notes (Signed)
A-line change skipped. Line set to expire 07/15/2016

## 2016-07-13 ENCOUNTER — Other Ambulatory Visit: Payer: Self-pay | Admitting: *Deleted

## 2016-07-13 ENCOUNTER — Inpatient Hospital Stay (HOSPITAL_COMMUNITY): Payer: Medicare Other

## 2016-07-13 DIAGNOSIS — I739 Peripheral vascular disease, unspecified: Secondary | ICD-10-CM

## 2016-07-13 DIAGNOSIS — Z86718 Personal history of other venous thrombosis and embolism: Secondary | ICD-10-CM

## 2016-07-13 LAB — CBC
HCT: 23 % — ABNORMAL LOW (ref 39.0–52.0)
HEMOGLOBIN: 7.8 g/dL — AB (ref 13.0–17.0)
MCH: 27.1 pg (ref 26.0–34.0)
MCHC: 33.9 g/dL (ref 30.0–36.0)
MCV: 79.9 fL (ref 78.0–100.0)
PLATELETS: 233 10*3/uL (ref 150–400)
RBC: 2.88 MIL/uL — AB (ref 4.22–5.81)
RDW: 15.2 % (ref 11.5–15.5)
WBC: 9.5 10*3/uL (ref 4.0–10.5)

## 2016-07-13 LAB — BASIC METABOLIC PANEL
Anion gap: 9 (ref 5–15)
BUN: 48 mg/dL — AB (ref 6–20)
CHLORIDE: 108 mmol/L (ref 101–111)
CO2: 17 mmol/L — ABNORMAL LOW (ref 22–32)
CREATININE: 3.48 mg/dL — AB (ref 0.61–1.24)
Calcium: 7.8 mg/dL — ABNORMAL LOW (ref 8.9–10.3)
GFR, EST AFRICAN AMERICAN: 19 mL/min — AB (ref >60)
GFR, EST NON AFRICAN AMERICAN: 17 mL/min — AB (ref >60)
Glucose, Bld: 116 mg/dL — ABNORMAL HIGH (ref 65–99)
Potassium: 4.6 mmol/L (ref 3.5–5.1)
SODIUM: 134 mmol/L — AB (ref 135–145)

## 2016-07-13 LAB — PHOSPHORUS: PHOSPHORUS: 4.7 mg/dL — AB (ref 2.5–4.6)

## 2016-07-13 LAB — GLUCOSE, CAPILLARY
GLUCOSE-CAPILLARY: 110 mg/dL — AB (ref 65–99)
GLUCOSE-CAPILLARY: 105 mg/dL — AB (ref 65–99)
Glucose-Capillary: 137 mg/dL — ABNORMAL HIGH (ref 65–99)
Glucose-Capillary: 149 mg/dL — ABNORMAL HIGH (ref 65–99)

## 2016-07-13 LAB — HEPARIN LEVEL (UNFRACTIONATED): HEPARIN UNFRACTIONATED: 0.39 [IU]/mL (ref 0.30–0.70)

## 2016-07-13 LAB — PROTIME-INR
INR: 1.02
PROTHROMBIN TIME: 13.4 s (ref 11.4–15.2)

## 2016-07-13 MED ORDER — LABETALOL HCL 100 MG PO TABS
300.0000 mg | ORAL_TABLET | Freq: Three times a day (TID) | ORAL | Status: DC
  Administered 2016-07-13 – 2016-07-17 (×12): 300 mg via ORAL
  Filled 2016-07-13: qty 3
  Filled 2016-07-13 (×2): qty 1
  Filled 2016-07-13: qty 3
  Filled 2016-07-13 (×2): qty 1
  Filled 2016-07-13: qty 3
  Filled 2016-07-13: qty 1
  Filled 2016-07-13 (×2): qty 3
  Filled 2016-07-13 (×2): qty 1
  Filled 2016-07-13: qty 3

## 2016-07-13 MED ORDER — IPRATROPIUM-ALBUTEROL 0.5-2.5 (3) MG/3ML IN SOLN
3.0000 mL | Freq: Four times a day (QID) | RESPIRATORY_TRACT | Status: DC
  Administered 2016-07-13 – 2016-07-15 (×9): 3 mL via RESPIRATORY_TRACT
  Filled 2016-07-13 (×9): qty 3

## 2016-07-13 MED ORDER — LACTULOSE 10 GM/15ML PO SOLN
30.0000 g | Freq: Three times a day (TID) | ORAL | Status: DC
  Administered 2016-07-14: 30 g via ORAL
  Filled 2016-07-13: qty 45

## 2016-07-13 MED ORDER — WARFARIN - PHARMACIST DOSING INPATIENT
Freq: Every day | Status: DC
  Administered 2016-07-16: 17:00:00

## 2016-07-13 MED ORDER — HYDRALAZINE HCL 25 MG PO TABS
25.0000 mg | ORAL_TABLET | Freq: Three times a day (TID) | ORAL | Status: DC
  Administered 2016-07-13 – 2016-07-15 (×3): 25 mg via ORAL
  Filled 2016-07-13 (×3): qty 1

## 2016-07-13 MED ORDER — AMLODIPINE BESYLATE 5 MG PO TABS
5.0000 mg | ORAL_TABLET | Freq: Every day | ORAL | Status: DC
  Administered 2016-07-14 – 2016-07-15 (×2): 5 mg via ORAL
  Filled 2016-07-13 (×2): qty 1

## 2016-07-13 MED ORDER — WARFARIN SODIUM 5 MG PO TABS
5.0000 mg | ORAL_TABLET | Freq: Once | ORAL | Status: AC
  Administered 2016-07-13: 5 mg via ORAL
  Filled 2016-07-13: qty 1

## 2016-07-13 MED ORDER — FUROSEMIDE 10 MG/ML IJ SOLN
80.0000 mg | Freq: Once | INTRAMUSCULAR | Status: AC
Start: 2016-07-13 — End: 2016-07-13
  Administered 2016-07-13: 80 mg via INTRAVENOUS
  Filled 2016-07-13: qty 8

## 2016-07-13 MED ORDER — ISOSORBIDE MONONITRATE ER 30 MG PO TB24
30.0000 mg | ORAL_TABLET | Freq: Every day | ORAL | Status: DC
  Administered 2016-07-14: 30 mg via ORAL
  Filled 2016-07-13: qty 1

## 2016-07-13 NOTE — Progress Notes (Signed)
Head to toe assessments unchanged, pt c/o both arm pain, MD aware per previous RN. IV d/c on left arm due to infiltration with NS, pt verbalizes relief of left arm pain. Refuses pain medicines. Heparin continues at 1800units/hr. No ss fo bleeding noted. No contact from family so far.

## 2016-07-13 NOTE — Progress Notes (Signed)
ANTICOAGULATION CONSULT NOTE - Follow Up Consult  Pharmacy Consult for Heparin and Coumadin Indication: DVT  No Known Allergies  Patient Measurements: Height: 6\' 1"  (185.4 cm) Weight: 212 lb 11.9 oz (96.5 kg) IBW/kg (Calculated) : 79.9 Heparin Dosing Weight: 96.5 kg  Vital Signs: Temp: 98.5 F (36.9 C) (11/08 1500) Temp Source: Oral (11/08 1500) BP: 109/69 (11/08 1200) Pulse Rate: 78 (11/08 1245)  Labs:  Recent Labs  07/11/16 0410 07/11/16 1100 07/11/16 1606 07/11/16 2218  07/12/16 0116 07/12/16 1148 07/12/16 2109 07/13/16 0252  HGB 8.7*  --   --   --   --  8.7*  --   --  7.8*  HCT 26.5*  --   --   --   --  26.4*  --   --  23.0*  PLT 194  --   --   --   --  211  --   --  233  HEPARINUNFRC  --   --   --   --   < > 0.14* 0.42 0.30 0.39  CREATININE 3.75*  --   --   --   --  3.72*  --   --  3.48*  TROPONINI 0.11* 0.09* 0.07* 0.07*  --   --   --   --   --   < > = values in this interval not displayed.  Estimated Creatinine Clearance: 24.5 mL/min (by C-G formula based on SCr of 3.48 mg/dL (H)).  Assessment:  70 yo male with uncontrolled hypertension and history of cardiac surgery and AAA repair (11/2015) found to have DVT in the left femoral vein.     Heparin level therapeutic (0.39) today on 1800 units/hr.      To begin Coumadin. Overlap day #1 of 5 minimum.     No baseline INR.  Albumin low at 2.4. May be sensitive to Coumadin.    Currently planning Coumadin for 3 months, then follow-up study.     Avoiding NOAC due to renal insufficiency.  Goal of Therapy:  INR 2-3 Heparin level 0.3-0.7 units/ml Monitor platelets by anticoagulation protocol: Yes   Plan:   Continue heparin drip at 1800 units/hr.  Baseline PT/INR ordered but not yet collected.  Will begin Coumadin with 5 mg x 1 today.  Daily heparin level, CBC, PT/INR.  Continue heparin/coumadin overlap at least 5 days and until INR >2 for 2 consecutive days  Dennie Fettersgan, Devonna Oboyle Donovan, [place] Pager:  [phone] 07/13/2016,4:27 PM

## 2016-07-13 NOTE — Progress Notes (Signed)
Patient ID: Mario Mccann, male   DOB: 12-Jun-1946, 70 y.o.   MRN: [id number]   Please NOTE Renal consult oupt set  NOV [address] Dr Bevelyn Ngounham Arrive 330 for 4 pm appt Mcarthur Rossettianiel J. Tyson AliasFeinstein, MD, FACP Pgr: [phone] [facility]

## 2016-07-13 NOTE — Progress Notes (Addendum)
PULMONARY / CRITICAL CARE MEDICINE   Name: Mario Mccann MRN: [id number] DOB: 05-27-1946    ADMISSION DATE:  07/08/2016  REFERRING MD:Dr. Rubin Payor, ER  CHIEF COMPLAINT:Back pain  HISTORY OF PRESENT ILLNESS: 70 yo male was eating dinner and then developed severe back pain. It was painful for him to take a deep breath, and he felt anxious. He came to ER. He was noted to have BP 230/137. His CXR showed tortuous aorta. 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 He has a history of cardiac surgery and AAA repair in the past.  He lived in [place] and received medical care there. He recently moved to [facility], and was in process of getting medical care set up with [employer] - he has not been assigned a primary care provider yet.  SUBJECTIVE: Comfortable in bed, wants to get up, BP systolic 140s on monitor, no BM yet, denies chest pain  VITAL SIGNS: BP 116/90   Pulse 70   Temp 98.5 F (36.9 C) (Oral)   Resp 19   Ht 6\' 1"  (1.854 m)   Wt 96.5 kg (212 lb 11.9 oz)   SpO2 96%   BMI 28.07 kg/m   HEMODYNAMICS:    VENTILATOR SETTINGS:    INTAKE / OUTPUT: I/O last 3 completed shifts: In: 3546.1 [P.O.:540; I.V.:3006.1] Out: 1950 [Urine:1950]  PHYSICAL EXAMINATION: General:  NAD Neuro:  Awake, alert HEENT:  [place], [place]/AT Cardiovascular:  rrr no mrg, mild lower extremity edema Lungs:  CTAB, symmetric chest wall motion  Abdomen:  Soft, nontender, mildly distended, bowel sounds present Musculoskeletal:  Moves all 4 extremities, right hand swollen distal to art line site, pulses strong, mild PIP joint tenderness on right hand Skin:  Clean, dry , intact  LABS:  BMET  Recent Labs Lab 07/11/16 0410 07/12/16 0116 07/13/16 0252  NA 135 135 134*  K 4.0 4.4 4.6  CL 107 109 108  CO2 20* 19* 17*  BUN 42* 48* 48*  CREATININE 3.75* 3.72* 3.48*  GLUCOSE 103* 98 116*    Electrolytes  Recent Labs Lab  07/11/16 0410 07/12/16 0116 07/13/16 0252  CALCIUM 7.7* 7.9* 7.8*  MG 1.6* 2.1  --   PHOS 4.2 4.2 4.7*    CBC  Recent Labs Lab 07/11/16 0410 07/12/16 0116 07/13/16 0252  WBC 9.2 9.4 9.5  HGB 8.7* 8.7* 7.8*  HCT 26.5* 26.4* 23.0*  PLT 194 211 233    Coag's No results for input(s): APTT, INR in the last 168 hours.  Sepsis Markers  Recent Labs Lab 07/11/16 0500  LATICACIDVEN 0.8    ABG No results for input(s): PHART, PCO2ART, PO2ART in the last 168 hours.  Liver Enzymes  Recent Labs Lab 07/11/16 0410 07/12/16 0116  AST 11* 12*  ALT 11* 9*  ALKPHOS 49 52  BILITOT 0.6 0.4  ALBUMIN 2.5* 2.4*    Cardiac Enzymes  Recent Labs Lab 07/11/16 1100 07/11/16 1606 07/11/16 2218  TROPONINI 0.09* 0.07* 0.07*    Glucose  Recent Labs Lab 07/11/16 2123 07/12/16 0737 07/12/16 1125 07/12/16 1525 07/12/16 2144 07/13/16 0734  GLUCAP 140* 108* 133* 138* 132* 110*    Imaging No results found.  STUDIES: MRA chest 11/03 >> type B aortic dissection from just beyond Lt subclavian throughout descending thoracic aorta ending about diaphragmatic hiatus, infrarenal AAA 5 cm, liver/renal cysts Echo 11/5 >> EF 55-60%, moderate LVH, no wall motion abnormalities, mildly calcified AV, mildy dilated ascending aorta at 3.7cm  CT chest/abd 11/6 >> - Fusiform infrarenal abdominal aortic aneurysm measuring up to 5.9 x 4.9 cm. - Stigmata of autosomal dominant polycystic kidney disease with innumerable cystic lesions in the kidneys and liver - Small volume ascites Doppler 11/6 >>deep vein thrombosis involving the left femoral vein  CULTURES:  ANTIBIOTICS:  SIGNIFICANT EVENTS: 11/03 Admit 11/4 added nicardipine as esmolol maxed out. Asked vascular to see 11/5 esmolol stopped d/t nausea. BP not at goal. Creatinine worse. Changed BP goal to <120. Added labetolol Cont nicardipine. Lasix given x 1. 11/6 DVT identified and anticoagulation started   LINES/TUBES: 11/4 Art  line >> removed 11/6  DISCUSSION: 70 yo male with sudden onset of back pain from HTN emergency and type B aortic dissection. He has a history of cardiac surgery and AAA repair in the past. Vascular surgery to reimage today. His creatinine is climbing. BP is at goal.   ASSESSMENT / PLAN:  PULMONARY A: No acute issues at this time P: IS Out of bed to chair today  CARDIOVASCULAR A:  HTN emergency with type B aortic dissection. Hx of CAD s/p CABG, HTN, HLD. PAD with infrarenal AAA. DVT left femoral vein P: - cont nicardipine --> titrate (goal SBP <120)- goal is to wean to off - done x 24 hrs - labetolol 200 tid -add oral home meds started 11/6 - goal SBP < 120, HR < 80 - Echo shows EF 55-60%, moderate LVH, no wall motion abnormalities, mildly calcified AV, mildy dilated ascending aorta at 3.7cm -on heparin, pharmacy to dose, therapeutic - vascular following  RENAL A:  CKD 3 - not sure what baseline renal fx is-->cr stable today- plat of crt noted NAGMA in setting of hyperchloremia-->improved Hypokalemia --> improved Hypomagnesemia --> improved Polycystic kidney disease? 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 P: SSI  NEUROLOGIC A:  Awake and alert P: Mobilize today  FAMILY - Updates: no family at bedside at this time  -  Inter-disciplinary family meet or Palliative Care meeting due by: 07/15/16  Ephriam JenkinsPaige Ganem, MS 4 Attending: Dr. Tyson AliasFeinstein   Pulmonary and Critical Care Medicine [facility] Pager: [phone]  07/13/2016, 8:11 AM  STAFF NOTE: Cindi CarbonI, Keagan Anthis, MD FACP have personally reviewed patient's available data, including medical history, events of note, physical examination and test results as part of my evaluation. I have discussed with resident/NP and other care providers such as pharmacist, RN and RRT. In addition, I personally evaluated patient and elicited key findings of: awake, alert, constipations remains, lungs clear, abdo soft, crt down, off nicardipine but remains sys 140, need to add additional meds for HTn control, keep tele, add lactulose for BM and enema, for duplex renal US, d/w rwnal and I agree , they will see him as outpt he is resolving likely back to baseline, he will see Dr Eliott Nineunham as outpt, follow crt, to sdu ,  triad, cosnider coumadin start and overlap, NO NOAC!!  Mcarthur Rossettianiel J. Tyson AliasFeinstein, MD, FACP Pgr: [phone] [facility] 07/13/2016 10:57 AM

## 2016-07-13 NOTE — Progress Notes (Signed)
eLink Physician-Brief Progress Note Patient Name: Serafina RoyalsCarl Muckleroy DOB: [birth year] MRN: [id number]   Date of Service  07/13/2016  HPI/Events of Note  Called by RN re: resp distress, increasing O2 requirements, increasing edema, wheezing. Pt appears in moderate dsitress, is on 6lpm [place] O2. BP was elevated earlier today but presently is borderline with multiple anti-hypertensive meds scheduled  eICU Interventions  1) Lasix 80 mg IV X 1 2) Nebulized bronchodilators 3) PRN BiPAP 4) Decrease dose of amlodipine and Imdur 5) Hold hydralazine for SBP < 105 or MAP < 65 mmHg 6) CXR in AM 11/09 ordered     Intervention Category Major Interventions: Respiratory failure - evaluation and management  Merwyn KatosDavid B Tywan Siever 07/13/2016, 8:26 PM

## 2016-07-14 ENCOUNTER — Other Ambulatory Visit: Payer: Self-pay | Admitting: *Deleted

## 2016-07-14 ENCOUNTER — Inpatient Hospital Stay (HOSPITAL_COMMUNITY): Payer: Medicare Other

## 2016-07-14 DIAGNOSIS — I71019 Dissection of thoracic aorta, unspecified: Secondary | ICD-10-CM

## 2016-07-14 DIAGNOSIS — I7101 Dissection of thoracic aorta: Secondary | ICD-10-CM

## 2016-07-14 DIAGNOSIS — I7102 Dissection of abdominal aorta: Secondary | ICD-10-CM

## 2016-07-14 DIAGNOSIS — I82412 Acute embolism and thrombosis of left femoral vein: Secondary | ICD-10-CM

## 2016-07-14 DIAGNOSIS — N183 Chronic kidney disease, stage 3 unspecified: Secondary | ICD-10-CM

## 2016-07-14 DIAGNOSIS — N179 Acute kidney failure, unspecified: Secondary | ICD-10-CM

## 2016-07-14 LAB — APTT: aPTT: 74 seconds — ABNORMAL HIGH (ref 24–36)

## 2016-07-14 LAB — BASIC METABOLIC PANEL
Anion gap: 11 (ref 5–15)
BUN: 50 mg/dL — ABNORMAL HIGH (ref 6–20)
CHLORIDE: 106 mmol/L (ref 101–111)
CO2: 20 mmol/L — AB (ref 22–32)
CREATININE: 3.41 mg/dL — AB (ref 0.61–1.24)
Calcium: 8.5 mg/dL — ABNORMAL LOW (ref 8.9–10.3)
GFR calc non Af Amer: 17 mL/min — ABNORMAL LOW (ref >60)
GFR, EST AFRICAN AMERICAN: 20 mL/min — AB (ref >60)
Glucose, Bld: 118 mg/dL — ABNORMAL HIGH (ref 65–99)
Potassium: 4.6 mmol/L (ref 3.5–5.1)
Sodium: 137 mmol/L (ref 135–145)

## 2016-07-14 LAB — GLUCOSE, CAPILLARY
GLUCOSE-CAPILLARY: 126 mg/dL — AB (ref 65–99)
GLUCOSE-CAPILLARY: 105 mg/dL — AB (ref 65–99)
GLUCOSE-CAPILLARY: 119 mg/dL — AB (ref 65–99)
Glucose-Capillary: 116 mg/dL — ABNORMAL HIGH (ref 65–99)

## 2016-07-14 LAB — CBC
HEMATOCRIT: 26.1 % — AB (ref 39.0–52.0)
HEMOGLOBIN: 8.8 g/dL — AB (ref 13.0–17.0)
MCH: 26.9 pg (ref 26.0–34.0)
MCHC: 33.7 g/dL (ref 30.0–36.0)
MCV: 79.8 fL (ref 78.0–100.0)
Platelets: 268 10*3/uL (ref 150–400)
RBC: 3.27 MIL/uL — ABNORMAL LOW (ref 4.22–5.81)
RDW: 15.1 % (ref 11.5–15.5)
WBC: 8.9 10*3/uL (ref 4.0–10.5)

## 2016-07-14 LAB — PROTIME-INR
INR: 1.08
PROTHROMBIN TIME: 14 s (ref 11.4–15.2)

## 2016-07-14 LAB — PHOSPHORUS: PHOSPHORUS: 5.7 mg/dL — AB (ref 2.5–4.6)

## 2016-07-14 LAB — HEPARIN LEVEL (UNFRACTIONATED): HEPARIN UNFRACTIONATED: 0.41 [IU]/mL (ref 0.30–0.70)

## 2016-07-14 MED ORDER — WARFARIN SODIUM 5 MG PO TABS
5.0000 mg | ORAL_TABLET | Freq: Once | ORAL | Status: AC
  Administered 2016-07-14: 5 mg via ORAL
  Filled 2016-07-14: qty 1

## 2016-07-14 NOTE — Progress Notes (Signed)
   VASCULAR SURGERY ASSESSMENT & PLAN:  TYPE B THORACIC AORTIC DISSECTION:  I will arrange f/u with Dr. Myra GianottiBrabham.   [place]OVASC[place]: Based on my review of the CT scan, the stent graft appears to be in good position. The aneurysm measures 5.5 cm in maximum diameter. I will order a follow up duplex in 6 months in my office.  CHRONIC LEFT LOWER EXTREMITY SWELLING: His venous duplex scan showed DVT in the left common femoral vein which may be chronic. As it is really not possible to determine how long he has had this clot I would recommend 3 months of anticoagulation and a follow up study at that time. I will arrange for the follow up study.  Dr Randie Heinzain is on call if any problems over the weekend.   SUBJECTIVE: No complaints  PHYSICAL EXAM: Vitals:   07/14/16 0500 07/14/16 0600 07/14/16 0700 07/14/16 0714  BP: 104/89 107/78 102/83   Pulse: 63 70 76   Resp: (!) 22 (!) 21 (!) 23   Temp:    98.7 F (37.1 C)  TempSrc:    Oral  SpO2: 96% 93% 92%   Weight: 208 lb 12.4 oz (94.7 kg)     Height:       Feet well perfused. No pulses.   LABS: Lab Results  Component Value Date   WBC 8.9 07/14/2016   HGB 8.8 (L) 07/14/2016   HCT 26.1 (L) 07/14/2016   MCV 79.8 07/14/2016   PLT 268 07/14/2016   Lab Results  Component Value Date   CREATININE 3.41 (H) 07/14/2016   Lab Results  Component Value Date   INR 1.08 07/14/2016   CBG (last 3)   Recent Labs  07/13/16 1534 07/13/16 2138 07/14/16 0709  GLUCAP 149* 105* 126*    Active Problems:   Thoracic aortic dissection (HCC)   Renal insufficiency   AKI (acute kidney injury) (HCC)    Cari Carawayhris Adelheid Hoggard Beeper: [phone] 07/14/2016

## 2016-07-14 NOTE — Progress Notes (Signed)
PROGRESS NOTE    Mario Mccann  ZOX:[id number] DOB: [birth year] DOA: 07/08/2016 PCP: No primary care provider on file.   Brief Narrative:  70 yo BM  PMHx HTN, CAD native artery, S/P AAA repair in the past, Diabetes type 2 , CKD stage III  Was eating dinner and then developed severe back pain.  It was painful for him to take a deep breath, and he felt anxious.  He came to ER.  He was noted to have BP 230/137.  His CXR showed tortuous aorta.  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  He lived in [place] and received medical care there.  He recently moved to [facility], and was in process of getting medical care set up with [employer] - he has not been assigned a primary care provider yet.    Subjective: 11/9  A/O 4, NAD, sitting in chair comfortably   Assessment & Plan:   Active Problems:   Thoracic aortic dissection (HCC)   Renal insufficiency   AKI (acute kidney injury) (HCC)   Acute renal failure superimposed on stage 3 chronic kidney disease (HCC)   Aortic dissection, thoracic (HCC)   DVT of deep femoral vein, left (HCC)   HTN emergency with type B aortic dissection./PAD with infrarenal AAA. -Resolved -Amlodipine 5 mg daily -Hydralazine 25 mg TID -Hydralazine PRN -Imdur 30 mg daily -Labetalol 300 mg TID -- goal SBP < 120, HR < 80  CAD s/p CABG,   DM type II. -Hemoglobin A1c pending -Lipid panel pending - Sensitive SSI  HLD.  Acute on CKD 3 (per records from [place] [facility] baseline Cr~2.6 to 3.0) Lab Results  Component Value Date   CREATININE 3.41 (H) 07/14/2016   CREATININE 3.48 (H) 07/13/2016   CREATININE 3.72 (H) 07/12/2016  -Patient slightly above baseline, but trending down  DVT left femoral vein -Heparin drip--> Coumadin  Acute on CKD 3 - not sure what baseline renal fx is-->cr stable today- plat of crt noted Lab Results  Component Value Date   CREATININE 3.41  (H) 07/14/2016   CREATININE 3.48 (H) 07/13/2016   CREATININE 3.72 (H) 07/12/2016    Polycystic kidney disease?  Hyponatremia -Resolved      DVT prophylaxis: Heparin drip--> Coumadin Code Status: Full Family Communication: None Disposition Plan: Per surgery   Consultants:  Dr.Christopher Adele Dan Vascular surgery Dr Nelda Bucks. PCCM      Procedures/Significant Events:  MRA chest 11/03>>type B aortic dissection from just beyond Lt subclavian throughout descending thoracic aorta ending about diaphragmatic hiatus, infrarenal AAA 5 cm, liver/renal cysts Echo 11/5 >> EF 55-60%, moderate LVH, no wall motion abnormalities, mildly calcified AV, mildy dilated ascending aorta at 3.7cm CT chest/abd 11/6>>-Fusiform infrarenal abdominal aortic aneurysm measuring up to 5.9 x 4.9 cm. 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 Asked vascular to see 11/5 esmolol stopped d/t nausea. BP not at goal. Creatinine worse. Changed BP goal to <120. Added labetolol Cont nicardipine. Lasix given x 1. 11/6 DVT identified and anticoagulation started    Cultures None  Antimicrobials: None   Devices    LINES / TUBES:      Continuous Infusions: . sodium chloride Stopped (07/14/16 0800)  . sodium chloride 10 mL/hr at 07/14/16 1900  . heparin 1,800  Units/hr (07/14/16 1900)     Objective: Vitals:   07/14/16 1629 07/14/16 1700 07/14/16 1800 07/14/16 1900  BP:  101/73 95/60 (!) 88/76  Pulse:  62 63 72  Resp:  (!) 26 18 (!) 25  Temp: 98.8 F (37.1 C)     TempSrc: Oral     SpO2:  90% 92% 92%  Weight:      Height:        Intake/Output Summary (Last 24 hours) at 07/14/16 1906 Last data filed at 07/14/16 1900  Gross per 24 hour  Intake             1480 ml    Output             4992 ml  Net            -3512 ml   Filed Weights   07/12/16 0400 07/13/16 0500 07/14/16 0500  Weight: 95.9 kg (211 lb 6.7 oz) 96.5 kg (212 lb 11.9 oz) 94.7 kg (208 lb 12.4 oz)    Examination:  General: A/O 4, NAD, sitting in chair comfortably, No acute respiratory distress Eyes: negative scleral hemorrhage, negative anisocoria, negative icterus ENT: Negative Runny nose, negative gingival bleeding, Neck:  Negative scars, masses, torticollis, lymphadenopathy, JVD Lungs: Clear to auscultation bilaterally without wheezes or crackles Cardiovascular: Regular rate and rhythm without murmur gallop or rub normal S1 and S2 Abdomen: negative abdominal pain, nondistended, positive soft, bowel sounds, no rebound, no ascites, no appreciable mass Extremities: No significant cyanosis, clubbing, or edema bilateral lower extremities Skin: Negative rashes, lesions, ulcers Psychiatric:  Negative depression, negative anxiety, negative fatigue, negative mania  Central nervous system:  Cranial nerves II through XII intact, tongue/uvula midline, all extremities muscle strength 5/5, sensation intact throughout, finger nose finger bilateral within normal limits, quick finger touch bilateral within normal limits,  negative dysarthria, negative expressive aphasia, negative receptive aphasia.  .     Data Reviewed: Care during the described time interval was provided by me .  I have reviewed this patient's available data, including medical history, events of note, physical examination, and all test results as part of my evaluation. I have personally reviewed and interpreted all radiology studies.  CBC:  Recent Labs Lab 07/08/16 1945  07/10/16 0400 07/11/16 0410 07/12/16 0116 07/13/16 0252 07/14/16 0305  WBC 8.1  < > 9.4 9.2 9.4 9.5 8.9  NEUTROABS 6.5  --   --   --   --   --   --   HGB 11.1*  < > 9.4* 8.7* 8.7* 7.8* 8.8*  HCT 33.5*  < > 28.6* 26.5* 26.4* 23.0* 26.1*  MCV 81.9  < >  82.2 81.5 81.2 79.9 79.8  PLT 220  < > 222 194 211 233 268  < > = values in this interval not displayed. Basic Metabolic Panel:  Recent Labs Lab 07/10/16 1610 07/11/16 0410 07/12/16 0116 07/13/16 0252 07/14/16 0305  NA 137 135 135 134* 137  K 4.0 4.0 4.4 4.6 4.6  CL 111 107 109 108 106  CO2 19* 20* 19* 17* 20*  GLUCOSE 122* 103* 98 116* 118*  BUN 40* 42* 48* 48* 50*  CREATININE 3.61* 3.75* 3.72* 3.48* 3.41*  CALCIUM 7.8* 7.7* 7.9* 7.8* 8.5*  MG  --  1.6* 2.1  --   --   PHOS  --  4.2 4.2 4.7* 5.7*   GFR: Estimated Creatinine Clearance: 23.1 mL/min (by C-G formula based on SCr of 3.41 mg/dL (H)). Liver Function Tests:  Recent Labs Lab 07/11/16 0410 07/12/16 0116  AST 11* 12*  ALT 11* 9*  ALKPHOS 49 52  BILITOT 0.6 0.4  PROT 5.9* 6.2*  ALBUMIN 2.5* 2.4*   No results for input(s): LIPASE, AMYLASE in the last 168 hours. No results for input(s): AMMONIA in the last 168 hours. Coagulation Profile:  Recent Labs Lab 07/13/16 1621 07/14/16 0305  INR 1.02 1.08   Cardiac Enzymes:  Recent Labs Lab 07/11/16 0410 07/11/16 1100 07/11/16 1606 07/11/16 2218  TROPONINI 0.11* 0.09* 0.07* 0.07*   BNP (last 3 results) No results for input(s): PROBNP in the last 8760 hours. HbA1C: No results for input(s): HGBA1C in the last 72 hours. CBG:  Recent Labs Lab 07/13/16 1534 07/13/16 2138 07/14/16 0709 07/14/16 1152 07/14/16 1628  GLUCAP 149* 105* 126* 105* 119*   Lipid Profile: No results for input(s): CHOL, HDL, LDLCALC, TRIG, CHOLHDL, LDLDIRECT in the last 72 hours. Thyroid Function Tests: No results for input(s): TSH, T4TOTAL, FREET4, T3FREE, THYROIDAB in the last 72 hours. Anemia Panel: No results for input(s): VITAMINB12, FOLATE, FERRITIN, TIBC, IRON, RETICCTPCT in the last 72 hours. Urine analysis:    Component Value Date/Time   COLORURINE YELLOW 07/11/2016 1442   APPEARANCEUR CLOUDY (A) 07/11/2016 1442   LABSPEC 1.019 07/11/2016 1442   PHURINE 5.0  07/11/2016 1442   GLUCOSEU NEGATIVE 07/11/2016 1442   HGBUR MODERATE (A) 07/11/2016 1442   BILIRUBINUR NEGATIVE 07/11/2016 1442   KETONESUR NEGATIVE 07/11/2016 1442   PROTEINUR NEGATIVE 07/11/2016 1442   NITRITE NEGATIVE 07/11/2016 1442   LEUKOCYTESUR MODERATE (A) 07/11/2016 1442   Sepsis Labs: @LABRCNTIP (procalcitonin:4,lacticidven:4)  ) Recent Results (from the past 240 hour(s))  MRSA PCR Screening     Status: None   Collection Time: 07/09/16  1:00 AM  Result Value Ref Range Status   MRSA by PCR NEGATIVE NEGATIVE Final    Comment:        The GeneXpert MRSA Assay (FDA approved for NASAL specimens only), is one component of a comprehensive MRSA colonization surveillance program. It is not intended to diagnose MRSA infection nor to guide or monitor treatment for MRSA infections.          Radiology Studies: Dg Chest Port 1 View  Result Date: 07/14/2016 CLINICAL DATA:  Respiratory failure.  Shortness of breath. EXAM: PORTABLE CHEST 1 VIEW COMPARISON:  07/13/2016 FINDINGS: Prior sternotomy and aortic atherosclerosis are again noted. Cardiac silhouette is normal in size. Left basilar density is unchanged and reflects a combination of small pleural effusion and parenchymal lung opacity. The right lung remains clear. No pneumothorax is seen. IMPRESSION: 1. Unchanged small left pleural effusion and left lower lobe atelectasis. 2. Aortic atherosclerosis. Electronically Signed   By: Sebastian Ache M.D.   On: 07/14/2016 08:07   Dg Chest Port 1 View  Result Date: 07/13/2016 CLINICAL DATA:  Pulmonary edema.  Shortness of breath.  Wheezing. EXAM: PORTABLE CHEST 1 VIEW COMPARISON:  09/08/2015 FINDINGS: The heart size and pulmonary vascularity are normal. Tortuosity and calcification of the thoracic aorta. New small left effusion and left base atelectasis. Right lung is clear. IMPRESSION: New small left pleural Electronically Signed   By: Francene Boyers M.D.   On: 07/13/2016 12:15         Scheduled Meds: . amLODipine  5 mg Oral Daily  . hydrALAZINE  25 mg Oral Q8H  . insulin aspart  0-5 Units Subcutaneous QHS  . insulin aspart  0-9 Units Subcutaneous TID WC  . ipratropium-albuterol  3 mL Nebulization Q6H  .  isosorbide mononitrate  30 mg Oral QHS  . labetalol  300 mg Oral TID  . lactulose  30 g Oral TID  . polyethylene glycol  17 g Oral Daily  . Warfarin - Pharmacist Dosing Inpatient   Does not apply q1800   Continuous Infusions: . sodium chloride Stopped (07/14/16 0800)  . sodium chloride 10 mL/hr at 07/14/16 1900  . heparin 1,800 Units/hr (07/14/16 1900)     LOS: 6 days    Time spent: 40 minutes    Marvene Strohm, Roselind MessierURTIS J, MD [facility] Pager [phone]   If 7PM-7AM, please contact night-coverage [email or web address] Password [email or web address] 07/14/2016, 7:06 PM

## 2016-07-14 NOTE — Evaluation (Signed)
Physical Therapy Evaluation Patient Details Name: Serafina RoyalsCarl Ferrucci MRN: [id number] DOB: 1946-08-15 Today's Date: 07/14/2016   History of Present Illness  70 yo recently moved from [place] admitted with back pain and thoracic aortic dissection and LLE DVT. PMHx: AAA repain, CABG, HTN, PAD  Clinical Impression  Pt pleasant and eager to return home. Pt reports decline in function over the last 2 weeks with increasing deficits acutely. Pt reports SOB ongoing for sometime but was not using supplemental O2. Pt with sats 96% on RA at rest with drop to 88% with limited ambulation but able to maintain sats >90% on 1L. Pt with decreased transfers, gait and endurance without secured 24hr care who will benefit from acute therapy to maximize independence, gait and activity tolerance.   HR 85 sats 88-96 on 1L BP 99/83 before, 75/51 after gait- no report of dizziness     Follow Up Recommendations Home health PT    Equipment Recommendations  None recommended by PT    Recommendations for Other Services OT consult     Precautions / Restrictions Precautions Precautions: Fall      Mobility  Bed Mobility Overal bed mobility: Needs Assistance Bed Mobility: Supine to Sit     Supine to sit: Supervision     General bed mobility comments: supervision for lines, increased time  Transfers Overall transfer level: Needs assistance   Transfers: Sit to/from Stand Sit to Stand: Min guard         General transfer comment: cues for hand placement and safety  Ambulation/Gait Ambulation/Gait assistance: Min guard Ambulation Distance (Feet): 150 Feet Assistive device: Rolling walker (2 wheeled) Gait Pattern/deviations: Step-through pattern;Decreased stride length;Trunk flexed   Gait velocity interpretation: Below normal speed for age/gender General Gait Details: very slow cautious gait with pt reporting 2/4 DOE throughout with sats dropping to 88% on RA, 92-96% on 1L with gait. Cues for posture,  position in RW and directional cues  Stairs            Wheelchair Mobility    Modified Rankin (Stroke Patients Only)       Balance Overall balance assessment: Needs assistance   Sitting balance-Leahy Scale: Good       Standing balance-Leahy Scale: Poor                               Pertinent Vitals/Pain Pain Assessment: 0-10 Pain Score: 4  Pain Location: back Pain Descriptors / Indicators: Aching;Sore Pain Intervention(s): Limited activity within patient's tolerance;Repositioned    Home Living Family/patient expects to be discharged to:: Private residence Living Arrangements: Children Available Help at Discharge: Family;Available 24 hours/day Type of Home: House Home Access: Stairs to enter   Entergy CorporationEntrance Stairs-Number of Steps: 6 Home Layout: One level Home Equipment: Cane - single point;Walker - 4 wheels;Wheelchair - manual;Bedside commode      Prior Function Level of Independence: Needs assistance   Gait / Transfers Assistance Needed: pt reports he holds the wall at home, cane or WC outside, limited distance with gait due to SOB  ADL's / Homemaking Assistance Needed: daughters do the shopping and housework, wife is still in [place] selling a house        Higher education careers adviserHand Dominance        Extremity/Trunk Assessment   Upper Extremity Assessment: Generalized weakness           Lower Extremity Assessment: Generalized weakness      Cervical / Trunk Assessment: Kyphotic  Communication  Communication: No difficulties  Cognition Arousal/Alertness: Awake/alert Behavior During Therapy: WFL for tasks assessed/performed Overall Cognitive Status: Within Functional Limits for tasks assessed                      General Comments      Exercises     Assessment/Plan    PT Assessment Patient needs continued PT services  PT Problem List Decreased strength;Decreased activity tolerance;Decreased mobility;Decreased knowledge of use of  DME;Cardiopulmonary status limiting activity          PT Treatment Interventions Gait training;Stair training;Functional mobility training;Balance training;Therapeutic exercise;Patient/family education;DME instruction;Therapeutic activities    PT Goals (Current goals can be found in the Care Plan section)  Acute Rehab PT Goals Patient Stated Goal: do nothing in retirement PT Goal Formulation: With patient Time For Goal Achievement: 07/28/16 Potential to Achieve Goals: Fair    Frequency Min 3X/week   Barriers to discharge Decreased caregiver support 3 daughters at home with him who work or are in school    Co-evaluation               End of Session Equipment Utilized During Treatment: Gait belt;Oxygen Activity Tolerance: Patient limited by fatigue Patient left: in chair;with call bell/phone within reach;with nursing/sitter in room Nurse Communication: Mobility status         Time: [id number] PT Time Calculation (min) (ACUTE ONLY): 21 min   Charges:   PT Evaluation $PT Eval Moderate Complexity: 1 Procedure     PT G CodesDelorse Lek:        Tabor, Shakila Mak Beth 07/14/2016, 1:49 PM  Delaney MeigsMaija Tabor Kirsi Hugh, PT [phone]

## 2016-07-14 NOTE — Progress Notes (Signed)
ANTICOAGULATION CONSULT NOTE - Follow Up Consult  Pharmacy Consult for Heparin and Coumadin Indication: DVT  No Known Allergies  Patient Measurements: Height: 6\' 1"  (185.4 cm) Weight: 208 lb 12.4 oz (94.7 kg) IBW/kg (Calculated) : 79.9 Heparin Dosing Weight: 96.5 kg  Vital Signs: Temp: 98.7 F (37.1 C) (11/09 0714) Temp Source: Oral (11/09 0714) BP: 104/91 (11/09 0900) Pulse Rate: 74 (11/09 0900)  Labs:  Recent Labs  07/11/16 1100 07/11/16 1606 07/11/16 2218  07/12/16 0116  07/12/16 2109 07/13/16 0252 07/13/16 1621 07/14/16 0305  HGB  --   --   --   < > 8.7*  --   --  7.8*  --  8.8*  HCT  --   --   --   --  26.4*  --   --  23.0*  --  26.1*  PLT  --   --   --   --  211  --   --  233  --  268  APTT  --   --   --   --   --   --   --   --   --  74*  LABPROT  --   --   --   --   --   --   --   --  13.4 14.0  INR  --   --   --   --   --   --   --   --  1.02 1.08  HEPARINUNFRC  --   --   --   --  0.14*  < > 0.30 0.39  --  0.41  CREATININE  --   --   --   --  3.72*  --   --  3.48*  --  3.41*  TROPONINI 0.09* 0.07* 0.07*  --   --   --   --   --   --   --   < > = values in this interval not displayed.  Estimated Creatinine Clearance: 23.1 mL/min (by C-G formula based on SCr of 3.41 mg/dL (H)).  Assessment:  70 yo male with uncontrolled hypertension and history of cardiac surgery and AAA repair (11/2015) found to have DVT in the left femoral vein.     Heparin level therapeutic (0.41) today on 1800 units/hr.      Started on Coumadin. Overlap day #2 of 5 minimum.     Albumin low at 2.4. May be sensitive to Coumadin.    Currently planning Coumadin for 3 months, then follow-up study.     Avoiding NOAC due to renal insufficiency.  Goal of Therapy:  INR 2-3 Heparin level 0.3-0.7 units/ml Monitor platelets by anticoagulation protocol: Yes   Plan:   Continue heparin drip at 1800 units/hr.  Will repeat Coumadin with 5 mg x 1 today.  Daily heparin level, CBC, PT/INR.  Continue heparin/coumadin overlap at least 5 days and until INR >2 for 2 consecutive days  Sheppard CoilFrank Wilson PharmD., BCPS Clinical Pharmacist Pager [phone] 07/14/2016 9:56 AM

## 2016-07-14 NOTE — Progress Notes (Signed)
Preliminary results by tech - Renal Duplex Completed. Technically difficult study due to bilateral cystic kidneys. The right and left renal arteries appeared patent without evidence of obvious significant stenosis. Bilateral kidney appeared enlarged with multiple cystic structures. The distal aorta demonstrated known aneurysmal dilatation.  Marilynne Halstedita Halli Equihua, BS, RDMS, RVT

## 2016-07-14 NOTE — Care Management Note (Addendum)
Case Management Note  Patient Details  Name: Mario RoyalsCarl Everitt MRN: [id number] Date of Birth: 1945/09/21  Subjective/Objective:         Admitted with back pain fround to have type B aortic dissection           Action/Plan:  PTA independent from home with wife and daughters.  Pt has recently moved from [place] and [place] [facility] [facility] [place] [facility].  CM request pt to have daughter bring VA contact information to CM so she can assist in locating a PCP at the local [facility] clinic.  CM will continue to monitor for discharge needs   Expected Discharge Date:                  Expected Discharge Plan:  Home/Self Care  In-House Referral:     Discharge planning Services  CM Consult  Post Acute Care Choice:    Choice offered to:     DME Arranged:    DME Agency:     HH Arranged:    HH Agency:     Status of Service:  In process, will continue to follow  If discussed at Long Length of Stay Meetings, dates discussed:    Additional Comments: 07/14/2016  Epic is only Chartered certified accountantlisting BCBS - CM re fax[place] cards [place] [facility] and spoke with [facility] directly in Admissions.  CM provided choice for HHPT to pt - [facility] - referral called into agency and agency unable to accept due [place] Selena Batten- [facility] and informed liaison that staffing will be available and referral will be accepted when orders are written and faxed.  [facility] explained to [facility] and he is in agreement with [facility] - pt declined to see choice list a second time.   CM verified with agency that [place] their service [place].  CM requested HH orders and CM consult via physician sticky note  07/12/16 CM received needed information from pt/daughter.  [facility] and set up initial appt with PCP Jamal MaesYee Simmons 07/22/16 at 12:20 at the [facility] location [address], [place], [place] [address].  CM provided information to  pt/family directly and provided on AVS.   [facility] also [facility] - VA should be primary and BCBS secondary  Cherylann ParrClaxton, Kavion Mancinas S, RN 07/14/2016, 2:24 PM

## 2016-07-15 ENCOUNTER — Telehealth: Payer: Self-pay | Admitting: Vascular Surgery

## 2016-07-15 LAB — BASIC METABOLIC PANEL
Anion gap: 10 (ref 5–15)
BUN: 53 mg/dL — AB (ref 6–20)
CALCIUM: 8.4 mg/dL — AB (ref 8.9–10.3)
CHLORIDE: 106 mmol/L (ref 101–111)
CO2: 20 mmol/L — ABNORMAL LOW (ref 22–32)
CREATININE: 3.52 mg/dL — AB (ref 0.61–1.24)
GFR calc non Af Amer: 16 mL/min — ABNORMAL LOW (ref >60)
GFR, EST AFRICAN AMERICAN: 19 mL/min — AB (ref >60)
Glucose, Bld: 103 mg/dL — ABNORMAL HIGH (ref 65–99)
Potassium: 4.3 mmol/L (ref 3.5–5.1)
SODIUM: 136 mmol/L (ref 135–145)

## 2016-07-15 LAB — LIPID PANEL
CHOL/HDL RATIO: 3.5 ratio
Cholesterol: 110 mg/dL (ref 0–200)
HDL: 31 mg/dL — AB (ref >40)
LDL Cholesterol: 65 mg/dL (ref 0–99)
Triglycerides: 70 mg/dL (ref <150)
VLDL: 14 mg/dL (ref 0–40)

## 2016-07-15 LAB — GLUCOSE, CAPILLARY
GLUCOSE-CAPILLARY: 107 mg/dL — AB (ref 65–99)
GLUCOSE-CAPILLARY: 119 mg/dL — AB (ref 65–99)
GLUCOSE-CAPILLARY: 108 mg/dL — AB (ref 65–99)
GLUCOSE-CAPILLARY: 106 mg/dL — AB (ref 65–99)

## 2016-07-15 LAB — CBC
HEMATOCRIT: 25.6 % — AB (ref 39.0–52.0)
Hemoglobin: 8.7 g/dL — ABNORMAL LOW (ref 13.0–17.0)
MCH: 27.1 pg (ref 26.0–34.0)
MCHC: 34 g/dL (ref 30.0–36.0)
MCV: 79.8 fL (ref 78.0–100.0)
PLATELETS: 279 10*3/uL (ref 150–400)
RBC: 3.21 MIL/uL — ABNORMAL LOW (ref 4.22–5.81)
RDW: 15 % (ref 11.5–15.5)
WBC: 8.4 10*3/uL (ref 4.0–10.5)

## 2016-07-15 LAB — MAGNESIUM: MAGNESIUM: 2.4 mg/dL (ref 1.7–2.4)

## 2016-07-15 LAB — HEPARIN LEVEL (UNFRACTIONATED): Heparin Unfractionated: 0.47 IU/mL (ref 0.30–0.70)

## 2016-07-15 LAB — PROTIME-INR
INR: 1.11
Prothrombin Time: 14.3 seconds (ref 11.4–15.2)

## 2016-07-15 MED ORDER — WARFARIN SODIUM 5 MG PO TABS
7.5000 mg | ORAL_TABLET | Freq: Once | ORAL | Status: AC
  Administered 2016-07-15: 7.5 mg via ORAL
  Filled 2016-07-15: qty 2

## 2016-07-15 MED ORDER — HYDRALAZINE HCL 25 MG PO TABS
37.5000 mg | ORAL_TABLET | Freq: Three times a day (TID) | ORAL | Status: DC
  Administered 2016-07-15 – 2016-07-17 (×7): 37.5 mg via ORAL
  Filled 2016-07-15 (×7): qty 2

## 2016-07-15 MED ORDER — ALLOPURINOL 100 MG PO TABS
100.0000 mg | ORAL_TABLET | Freq: Every day | ORAL | Status: DC
  Administered 2016-07-15 – 2016-07-17 (×3): 100 mg via ORAL
  Filled 2016-07-15 (×3): qty 1

## 2016-07-15 MED ORDER — ISOSORBIDE DINITRATE 20 MG PO TABS
20.0000 mg | ORAL_TABLET | Freq: Three times a day (TID) | ORAL | Status: DC
  Administered 2016-07-15 – 2016-07-17 (×8): 20 mg via ORAL
  Filled 2016-07-15 (×10): qty 1

## 2016-07-15 MED ORDER — AMLODIPINE BESYLATE 10 MG PO TABS
10.0000 mg | ORAL_TABLET | Freq: Every day | ORAL | Status: DC
  Administered 2016-07-16 – 2016-07-17 (×2): 10 mg via ORAL
  Filled 2016-07-15 (×2): qty 1

## 2016-07-15 MED ORDER — ISOSORBIDE DINITRATE 10 MG PO TABS
10.0000 mg | ORAL_TABLET | Freq: Three times a day (TID) | ORAL | Status: DC
  Filled 2016-07-15: qty 1

## 2016-07-15 MED ORDER — VITAMIN D 1000 UNITS PO TABS
2000.0000 [IU] | ORAL_TABLET | Freq: Every day | ORAL | Status: DC
  Administered 2016-07-15 – 2016-07-17 (×3): 2000 [IU] via ORAL
  Filled 2016-07-15 (×3): qty 2

## 2016-07-15 MED ORDER — FINASTERIDE 5 MG PO TABS
5.0000 mg | ORAL_TABLET | Freq: Every day | ORAL | Status: DC
Start: 2016-07-15 — End: 2016-07-17
  Administered 2016-07-15 – 2016-07-17 (×3): 5 mg via ORAL
  Filled 2016-07-15 (×3): qty 1

## 2016-07-15 MED ORDER — EZETIMIBE 10 MG PO TABS
10.0000 mg | ORAL_TABLET | Freq: Every day | ORAL | Status: DC
Start: 2016-07-15 — End: 2016-07-17
  Administered 2016-07-15 – 2016-07-17 (×3): 10 mg via ORAL
  Filled 2016-07-15 (×3): qty 1

## 2016-07-15 MED ORDER — IPRATROPIUM-ALBUTEROL 0.5-2.5 (3) MG/3ML IN SOLN
3.0000 mL | Freq: Three times a day (TID) | RESPIRATORY_TRACT | Status: DC
  Administered 2016-07-16: 3 mL via RESPIRATORY_TRACT
  Filled 2016-07-15: qty 3

## 2016-07-15 MED ORDER — LACTULOSE 10 GM/15ML PO SOLN: 30.0000 g | Freq: Three times a day (TID) | ORAL | Status: DC | PRN

## 2016-07-15 MED ORDER — OXYBUTYNIN CHLORIDE ER 10 MG PO TB24
10.0000 mg | ORAL_TABLET | Freq: Every day | ORAL | Status: DC
  Administered 2016-07-15 – 2016-07-16 (×2): 10 mg via ORAL
  Filled 2016-07-15 (×2): qty 1

## 2016-07-15 MED ORDER — ATORVASTATIN CALCIUM 80 MG PO TABS
80.0000 mg | ORAL_TABLET | Freq: Every day | ORAL | Status: DC
  Administered 2016-07-15 – 2016-07-17 (×3): 80 mg via ORAL
  Filled 2016-07-15 (×3): qty 1

## 2016-07-15 NOTE — Telephone Encounter (Signed)
I called the patient today to remind him of his appointments set for February and may 2018. Dr. Edilia Boickson also order two cat scans and I wanted to know where the patient would like to have these. The patient [facility] [place].

## 2016-07-15 NOTE — Progress Notes (Signed)
ANTICOAGULATION CONSULT NOTE - Follow Up Consult  Pharmacy Consult for Heparin and Coumadin Indication: DVT   Assessment: 70 yo male with uncontrolled hypertension and history of cardiac surgery and AAA repair (11/2015) found to have DVT in the left femoral vein.  Heparin level therapeutic (0.47) today on 1800 units/hr.   Started on Coumadin, INR very little change at 1.1. Overlap day #3 of 5 minimum.  Albumin low at 2.4. May be sensitive to Coumadin. Currently planning Coumadin for 3 months, then follow-up study.    Goal of Therapy:  INR 2-3 Heparin level 0.3-0.7 units/ml Monitor platelets by anticoagulation protocol: Yes   Plan:   Continue heparin drip at 1800 units/hr.  Coumadin with 7.5 mg x 1 today.  Daily heparin level, CBC, PT/INR.  Continue heparin/coumadin overlap at least 5 days and until INR >2 for 2 consecutive days   No Known Allergies  Patient Measurements: Height: 6\' 1"  (185.4 cm) Weight: 194 lb 7.1 oz (88.2 kg) IBW/kg (Calculated) : 79.9 Heparin Dosing Weight: 96.5 kg  Vital Signs: Temp: 97.3 F (36.3 C) (11/10 0744) Temp Source: Oral (11/10 0744) BP: 121/73 (11/10 1000) Pulse Rate: 65 (11/10 1000)  Labs:  Recent Labs  07/13/16 0252 07/13/16 1621 07/14/16 0305 07/15/16 0352  HGB 7.8*  --  8.8* 8.7*  HCT 23.0*  --  26.1* 25.6*  PLT 233  --  268 279  APTT  --   --  74*  --   LABPROT  --  13.4 14.0 14.3  INR  --  1.02 1.08 1.11  HEPARINUNFRC 0.39  --  0.41 0.47  CREATININE 3.48*  --  3.41* 3.52*    Estimated Creatinine Clearance: 22.4 mL/min (by C-G formula based on SCr of 3.52 mg/dL (H)).  Sheppard CoilFrank Wilson PharmD., BCPS Clinical Pharmacist Pager [phone] 07/15/2016 10:32 AM

## 2016-07-15 NOTE — Care Management Important Message (Signed)
Important Message  Patient Details  Name: Mario Mccann MRN: [id number] Date of Birth: 11/19/1945   Medicare Important Message Given:  Yes    [name] 07/15/2016, 10:55 AM

## 2016-07-15 NOTE — Progress Notes (Signed)
[facility] TEAM 1 - Stepdown/ICU TEAM  Serafina RoyalsCarl Fludd  OZH:[id number] DOB: April 05, 1946 DOA: 07/08/2016 PCP: No primary care provider on file.    Brief Narrative:  70 yo M Hx HTN, CAD, S/P AAA repair, DM2, and CKD stage III who developed severe back pain. He came to the ER and was noted to have BP 230/137. His CXR showed tortuous aorta. MRA chest revealed a type B aortic dissection and infrarenal AAA.  [place], and does not have a local MD.  He is a Sales executivemilitary veteran.  Significant Events: 11/3 Admit 11/4 added nicardipine as esmolol maxed out. Asked vascular to see 11/5 esmolol stopped d/t nausea. BP not at goal. Creatinine worse. Changed BP goal to <120. Added labetolol Cont nicardipine. Lasix given x 1. 11/5 TTE EF 55-60%, moderate LVH, no wall motion abnormalities, mildly calcified AV, mildy dilated ascending aorta at 3.7cm 11/6 DVT identified via doppler and anticoagulation started   Subjective: The patient is resting comfortably in a bedside chair.  He denies chest pain shortness breath fevers chills nausea or vomiting.  He is quite pleasant.  He is anxious to be discharged home as soon as he can be.  Assessment & Plan:  Type B Thoracic Aortic dissection Strict blood pressure control indicated  HTN emergency Emergency resolved but further titration of blood pressure medications required  PAD with infrarenal AAA S/p endovascular repair in [place] - Vascular Surgery following  DVT left femoral vein - acute v/s chronic  Heparin drip--> Coumadin - to complete 3 months of anticoag w/ plan to re-image at that time - unable to use NOAC or Lovenox given significant renal failure  CAD s/p CABG  Asymptomatic  DM2 Well-controlled at present  HLD Resume Lipitor   Acute on CKD 3 baseline Cr ~2.6 to 3.0 ([facility] [facility]) - [facility] been [facility] on Nov 30 at  3:30PM  Polycystic kidney disease?  Hyponatremia Corrected  DVT prophylaxis: IV heparin > coumadin  Code Status: FULL CODE Family Communication: no family present at time of exam  Disposition Plan: transfer to med bed - d/c when INR at goal   Consultants:  Vasc Surgery PCCM  Antimicrobials:  none  Objective: Blood pressure 133/88, pulse 79, temperature 97.3 F (36.3 C), temperature source Oral, resp. rate (!) 22, height 6\' 1"  (1.854 m), weight 88.2 kg (194 lb 7.1 oz), SpO2 94 %.  Intake/Output Summary (Last 24 hours) at 07/15/16 1006 Last data filed at 07/15/16 0900  Gross per 24 hour  Intake             1304 ml  Output             1595 ml  Net             -291 ml   Filed Weights   07/13/16 0500 07/14/16 0500 07/15/16 0500  Weight: 96.5 kg (212 lb 11.9 oz) 94.7 kg (208 lb 12.4 oz) 88.2 kg (194 lb 7.1 oz)    Examination: General: No acute respiratory distress Lungs: Clear to auscultation bilaterally without wheezes or crackles Cardiovascular: Regular rate and rhythm without murmur gallop or rub normal S1 and S2 Abdomen: Nontender, nondistended, soft, bowel sounds positive, no rebound, no ascites, no appreciable mass Extremities: No significant cyanosis, clubbing, or edema bilateral lower extremities  CBC:  Recent Labs Lab 07/08/16 1945  07/11/16 0410 07/12/16 0116 07/13/16 0252 07/14/16 0305 07/15/16  0352  WBC 8.1  < > 9.2 9.4 9.5 8.9 8.4  NEUTROABS 6.5  --   --   --   --   --   --   HGB 11.1*  < > 8.7* 8.7* 7.8* 8.8* 8.7*  HCT 33.5*  < > 26.5* 26.4* 23.0* 26.1* 25.6*  MCV 81.9  < > 81.5 81.2 79.9 79.8 79.8  PLT 220  < > 194 211 233 268 279  < > = values in this interval not displayed. Basic Metabolic Panel:  Recent Labs Lab 07/11/16 0410 07/12/16 0116 07/13/16 0252 07/14/16 0305 07/15/16 0352  NA 135 135 134* 137 136  K 4.0 4.4 4.6 4.6 4.3  CL 107 109 108 106 106  CO2 20* 19* 17* 20* 20*  GLUCOSE 103* 98 116* 118* 103*  BUN 42* 48* 48* 50*  53*  CREATININE 3.75* 3.72* 3.48* 3.41* 3.52*  CALCIUM 7.7* 7.9* 7.8* 8.5* 8.4*  MG 1.6* 2.1  --   --  2.4  PHOS 4.2 4.2 4.7* 5.7*  --    GFR: Estimated Creatinine Clearance: 22.4 mL/min (by C-G formula based on SCr of 3.52 mg/dL (H)).  Liver Function Tests:  Recent Labs Lab 07/11/16 0410 07/12/16 0116  AST 11* 12*  ALT 11* 9*  ALKPHOS 49 52  BILITOT 0.6 0.4  PROT 5.9* 6.2*  ALBUMIN 2.5* 2.4*    Coagulation Profile:  Recent Labs Lab 07/13/16 1621 07/14/16 0305 07/15/16 0352  INR 1.02 1.08 1.11    Cardiac Enzymes:  Recent Labs Lab 07/11/16 0410 07/11/16 1100 07/11/16 1606 07/11/16 2218  TROPONINI 0.11* 0.09* 0.07* 0.07*    CBG:  Recent Labs Lab 07/14/16 0709 07/14/16 1152 07/14/16 1628 07/14/16 2127 07/15/16 0741  GLUCAP 126* 105* 119* 116* 107*    Recent Results (from the past 240 hour(s))  MRSA PCR Screening     Status: None   Collection Time: 07/09/16  1:00 AM  Result Value Ref Range Status   MRSA by PCR NEGATIVE NEGATIVE Final    Comment:        The GeneXpert MRSA Assay (FDA approved for NASAL specimens only), is one component of a comprehensive MRSA colonization surveillance program. It is not intended to diagnose MRSA infection nor to guide or monitor treatment for MRSA infections.      Scheduled Meds: . [START ON 07/16/2016] amLODipine  10 mg Oral Daily  . hydrALAZINE  25 mg Oral Q8H  . insulin aspart  0-5 Units Subcutaneous QHS  . insulin aspart  0-9 Units Subcutaneous TID WC  . ipratropium-albuterol  3 mL Nebulization Q6H  . isosorbide mononitrate  30 mg Oral QHS  . labetalol  300 mg Oral TID  . lactulose  30 g Oral TID  . polyethylene glycol  17 g Oral Daily  . Warfarin - Pharmacist Dosing Inpatient   Does not apply q1800   Continuous Infusions: . sodium chloride 10 mL/hr at 07/15/16 0700  . heparin 1,800 Units/hr (07/15/16 0700)     LOS: 7 days   Lonia BloodJeffrey T. Micah Galeno, MD [facility] Office   [phone] Pager - Text Page per Amion as per below:  On-Call/Text Page:      [email or web address]      password [email or web address]  If 7PM-7AM, please contact night-coverage [email or web address] Password [email or web address] 07/15/2016, 10:06 AM

## 2016-07-16 DIAGNOSIS — N183 Chronic kidney disease, stage 3 (moderate): Secondary | ICD-10-CM

## 2016-07-16 LAB — HEMOGLOBIN A1C
HEMOGLOBIN A1C: 5.5 % (ref 4.8–5.6)
MEAN PLASMA GLUCOSE: 111 mg/dL

## 2016-07-16 LAB — BASIC METABOLIC PANEL
Anion gap: 13 (ref 5–15)
BUN: 61 mg/dL — ABNORMAL HIGH (ref 6–20)
CHLORIDE: 107 mmol/L (ref 101–111)
CO2: 16 mmol/L — ABNORMAL LOW (ref 22–32)
Calcium: 8.4 mg/dL — ABNORMAL LOW (ref 8.9–10.3)
Creatinine, Ser: 3.92 mg/dL — ABNORMAL HIGH (ref 0.61–1.24)
GFR calc non Af Amer: 14 mL/min — ABNORMAL LOW (ref >60)
GFR, EST AFRICAN AMERICAN: 17 mL/min — AB (ref >60)
Glucose, Bld: 97 mg/dL (ref 65–99)
POTASSIUM: 4.4 mmol/L (ref 3.5–5.1)
SODIUM: 136 mmol/L (ref 135–145)

## 2016-07-16 LAB — GLUCOSE, CAPILLARY
GLUCOSE-CAPILLARY: 100 mg/dL — AB (ref 65–99)
GLUCOSE-CAPILLARY: 106 mg/dL — AB (ref 65–99)
GLUCOSE-CAPILLARY: 112 mg/dL — AB (ref 65–99)
Glucose-Capillary: 106 mg/dL — ABNORMAL HIGH (ref 65–99)

## 2016-07-16 LAB — CBC
HCT: 24 % — ABNORMAL LOW (ref 39.0–52.0)
HEMOGLOBIN: 8.2 g/dL — AB (ref 13.0–17.0)
MCH: 27.2 pg (ref 26.0–34.0)
MCHC: 34.2 g/dL (ref 30.0–36.0)
MCV: 79.7 fL (ref 78.0–100.0)
PLATELETS: 296 10*3/uL (ref 150–400)
RBC: 3.01 MIL/uL — AB (ref 4.22–5.81)
RDW: 14.9 % (ref 11.5–15.5)
WBC: 7.9 10*3/uL (ref 4.0–10.5)

## 2016-07-16 LAB — PROTIME-INR
INR: 1.44
Prothrombin Time: 17.7 seconds — ABNORMAL HIGH (ref 11.4–15.2)

## 2016-07-16 LAB — HEPARIN LEVEL (UNFRACTIONATED): Heparin Unfractionated: 0.42 IU/mL (ref 0.30–0.70)

## 2016-07-16 MED ORDER — WARFARIN SODIUM 5 MG PO TABS
7.5000 mg | ORAL_TABLET | Freq: Once | ORAL | Status: AC
  Administered 2016-07-16: 7.5 mg via ORAL
  Filled 2016-07-16: qty 2

## 2016-07-16 MED ORDER — GUAIFENESIN 100 MG/5ML PO SOLN
5.0000 mL | ORAL | Status: DC | PRN
  Administered 2016-07-16 – 2016-07-17 (×4): 100 mg via ORAL
  Filled 2016-07-16: qty 5
  Filled 2016-07-16: qty 25
  Filled 2016-07-16 (×2): qty 5

## 2016-07-16 MED ORDER — IPRATROPIUM-ALBUTEROL 0.5-2.5 (3) MG/3ML IN SOLN
3.0000 mL | RESPIRATORY_TRACT | Status: DC | PRN
  Administered 2016-07-16: 3 mL via RESPIRATORY_TRACT
  Filled 2016-07-16: qty 3

## 2016-07-16 MED ORDER — COUMADIN BOOK
Freq: Once | Status: AC
  Administered 2016-07-16: 16:00:00
  Filled 2016-07-16 (×2): qty 1

## 2016-07-16 MED ORDER — WARFARIN VIDEO
Freq: Once | Status: AC
  Administered 2016-07-16: 10:00:00

## 2016-07-16 NOTE — Progress Notes (Signed)
ANTICOAGULATION CONSULT NOTE - Follow Up Consult  Pharmacy Consult for Heparin and Coumadin Indication: DVT, newly discovered but may be chronic per note  Assessment: 70 yo male with uncontrolled hypertension and history of cardiac surgery and AAA repair (11/2015) found to have DVT in the left femoral vein.  Heparin level therapeutic (0.42) today on 1800 units/hr.   Started on Coumadin. INR subtherapeutic, as expected after 3 doses, today with bump from 1.1>1.4. Today is overlap day #4 of 5 minimum.  Albumin low at 2.4, not eating full meals, may be sensitive to Coumadin. Currently planning Coumadin for 3 months, then follow-up study.    Goal of Therapy:  INR 2-3 Heparin level 0.3-0.7 units/ml Monitor platelets by anticoagulation protocol: Yes   Plan:   Continue heparin drip at 1800 units/hr.  Coumadin with 7.5 mg x 1 today.  Daily heparin level, CBC, PT/INR.  Continue heparin/coumadin overlap at least 5 days and until INR >2 for two consecutive days   No Known Allergies  Patient Measurements: Height: 6\' 1"  (185.4 cm) Weight: 199 lb 15.3 oz (90.7 kg) IBW/kg (Calculated) : 79.9 Heparin Dosing Weight: 96.5 kg  Vital Signs: Temp: 98.3 F (36.8 C) (11/11 0512) Temp Source: Oral (11/11 0512) BP: 120/65 (11/11 0512) Pulse Rate: 67 (11/11 0512)  Labs:  Recent Labs  07/14/16 0305 07/15/16 0352 07/16/16 0425  HGB 8.8* 8.7* 8.2*  HCT 26.1* 25.6* 24.0*  PLT 268 279 296  APTT 74*  --   --   LABPROT 14.0 14.3 17.7*  INR 1.08 1.11 1.44  HEPARINUNFRC 0.41 0.47 0.42  CREATININE 3.41* 3.52* 3.92*    Estimated Creatinine Clearance: 20.1 mL/min (by C-G formula based on SCr of 3.92 mg/dL (H)).  Allena Katzaroline E Welles, Pharm.D. PGY1 Pharmacy Resident 11/11/20178:45 AM Pager [phone]

## 2016-07-16 NOTE — Progress Notes (Signed)
Mario RoyalsCarl Mccann  MVH:[id number] DOB: 08-24-1946 DOA: 07/08/2016  PCP: No primary care provider on file.    Brief Narrative:  70 yo M Hx HTN, CAD, S/P AAA repair, DM2, and CKD stage III who developed severe back pain. He came to the ER and was noted to have BP 230/137. His CXR showed tortuous aorta. MRA chest revealed a type B aortic dissection and infrarenal AAA.  [place] and does not have a local MD.  He is a Sales executivemilitary veteran.  Significant Events: 11/3 Admit 11/4 added nicardipine as esmolol maxed out. Asked vascular to see 11/5 esmolol stopped d/t nausea. BP not at goal. Creatinine worse. Changed BP goal to <120. Added labetolol Cont nicardipine. Lasix given x 1. 11/5 TTE EF 55-60%, moderate LVH, no wall motion abnormalities, mildly calcified AV, mildy dilated ascending aorta at 3.7cm 11/6 DVT identified via doppler and anticoagulation started   Subjective: The patient is resting comfortably in a bedside chair.  He denies chest pain shortness breath fevers chills nausea or vomiting.  He is quite pleasant.  He is anxious to be discharged home as soon as he can be.  Assessment & Plan:  Type B Thoracic Aortic dissection Strict blood pressure control indicated BP currently stable   HTN emergency Emergency resolved but further titration of blood pressure medications required  PAD with infrarenal AAA S/p endovascular repair in [place] - Vascular Surgery following  DVT left femoral vein - acute v/s chronic  Continue Coumadin INR still subtherapeutic  CAD s/p CABG  Asymptomatic  DM2 with complications of nephropathy  Well-controlled at present but Cr is up a bit in the past 24 hours BMP in AM  HLD Continue Lipitor   Acute on CKD 3 baseline Cr ~2.6 to 3.0 (per [facility] [place] [facility])  [facility] Nov 30 at 3:30PM  Polycystic kidney  disease?  Hyponatremia Corrected  DVT prophylaxis: IV heparin > coumadin  Code Status: FULL CODE Family Communication: no family present at time of exam  Disposition Plan: d/c when INR at goal   Consultants:  Vasc Surgery PCCM  Antimicrobials:  none  Objective: Blood pressure 106/65, pulse 66, temperature 98.4 F (36.9 C), temperature source Oral, resp. rate 18, height 6\' 1"  (1.854 m), weight 90.7 kg (199 lb 15.3 oz), SpO2 93 %.  Intake/Output Summary (Last 24 hours) at 07/16/16 1737 Last data filed at 07/16/16 1342  Gross per 24 hour  Intake              300 ml  Output              200 ml  Net              100 ml   Filed Weights   07/14/16 0500 07/15/16 0500 07/16/16 0512  Weight: 94.7 kg (208 lb 12.4 oz) 88.2 kg (194 lb 7.1 oz) 90.7 kg (199 lb 15.3 oz)    Examination: General: No acute respiratory distress Lungs: Clear to auscultation bilaterally without wheezes or crackles Cardiovascular: Regular rate and rhythm without murmur gallop or rub normal S1 and S2 Abdomen: Nontender, nondistended, soft, bowel sounds positive, no rebound, no ascites, no appreciable mass Extremities: No significant cyanosis, clubbing, or edema bilateral lower extremities  CBC:  Recent Labs Lab 07/12/16 0116 07/13/16 0252 07/14/16 0305 07/15/16 0352 07/16/16 0425  WBC 9.4 9.5 8.9 8.4 7.9  HGB 8.7*  7.8* 8.8* 8.7* 8.2*  HCT 26.4* 23.0* 26.1* 25.6* 24.0*  MCV 81.2 79.9 79.8 79.8 79.7  PLT 211 233 268 279 296   Basic Metabolic Panel:  Recent Labs Lab 07/11/16 0410 07/12/16 0116 07/13/16 0252 07/14/16 0305 07/15/16 0352 07/16/16 0425  NA 135 135 134* 137 136 136  K 4.0 4.4 4.6 4.6 4.3 4.4  CL 107 109 108 106 106 107  CO2 20* 19* 17* 20* 20* 16*  GLUCOSE 103* 98 116* 118* 103* 97  BUN 42* 48* 48* 50* 53* 61*  CREATININE 3.75* 3.72* 3.48* 3.41* 3.52* 3.92*  CALCIUM 7.7* 7.9* 7.8* 8.5* 8.4* 8.4*  MG 1.6* 2.1  --   --  2.4  --   PHOS 4.2 4.2 4.7* 5.7*  --   --     GFR: Estimated Creatinine Clearance: 20.1 mL/min (by C-G formula based on SCr of 3.92 mg/dL (H)).  Liver Function Tests:  Recent Labs Lab 07/11/16 0410 07/12/16 0116  AST 11* 12*  ALT 11* 9*  ALKPHOS 49 52  BILITOT 0.6 0.4  PROT 5.9* 6.2*  ALBUMIN 2.5* 2.4*    Coagulation Profile:  Recent Labs Lab 07/13/16 1621 07/14/16 0305 07/15/16 0352 07/16/16 0425  INR 1.02 1.08 1.11 1.44    Cardiac Enzymes:  Recent Labs Lab 07/11/16 0410 07/11/16 1100 07/11/16 1606 07/11/16 2218  TROPONINI 0.11* 0.09* 0.07* 0.07*    CBG:  Recent Labs Lab 07/15/16 1742 07/15/16 2116 07/16/16 0828 07/16/16 1154 07/16/16 1720  GLUCAP 108* 106* 100* 106* 106*    Recent Results (from the past 240 hour(s))  MRSA PCR Screening     Status: None   Collection Time: 07/09/16  1:00 AM  Result Value Ref Range Status   MRSA by PCR NEGATIVE NEGATIVE Final    Comment:        The GeneXpert MRSA Assay (FDA approved for NASAL specimens only), is one component of a comprehensive MRSA colonization surveillance program. It is not intended to diagnose MRSA infection nor to guide or monitor treatment for MRSA infections.      Scheduled Meds: . allopurinol  100 mg Oral Daily  . amLODipine  10 mg Oral Daily  . atorvastatin  80 mg Oral Daily  . cholecalciferol  2,000 Units Oral Daily  . ezetimibe  10 mg Oral Daily  . finasteride  5 mg Oral Daily  . hydrALAZINE  37.5 mg Oral Q8H  . insulin aspart  0-5 Units Subcutaneous QHS  . insulin aspart  0-9 Units Subcutaneous TID WC  . isosorbide dinitrate  20 mg Oral TID  . labetalol  300 mg Oral TID  . oxybutynin  10 mg Oral QHS  . polyethylene glycol  17 g Oral Daily  . Warfarin - Pharmacist Dosing Inpatient   Does not apply q1800   Continuous Infusions: . sodium chloride 10 mL/hr at 07/15/16 0700  . heparin 1,800 Units/hr (07/16/16 1613)     LOS: 8 days   Debbora PrestoMAGICK-Annamaria Salah, MD  [facility] Pager [phone]  If 7PM-7AM,  please contact night-coverage [email or web address] Password [email or web address]  07/16/2016, 5:37 PM

## 2016-07-17 ENCOUNTER — Inpatient Hospital Stay (HOSPITAL_COMMUNITY): Payer: Medicare Other

## 2016-07-17 LAB — BASIC METABOLIC PANEL
Anion gap: 12 (ref 5–15)
BUN: 68 mg/dL — AB (ref 6–20)
CALCIUM: 8.7 mg/dL — AB (ref 8.9–10.3)
CO2: 17 mmol/L — ABNORMAL LOW (ref 22–32)
CREATININE: 4.27 mg/dL — AB (ref 0.61–1.24)
Chloride: 108 mmol/L (ref 101–111)
GFR calc Af Amer: 15 mL/min — ABNORMAL LOW (ref >60)
GFR, EST NON AFRICAN AMERICAN: 13 mL/min — AB (ref >60)
Glucose, Bld: 98 mg/dL (ref 65–99)
POTASSIUM: 4.4 mmol/L (ref 3.5–5.1)
SODIUM: 137 mmol/L (ref 135–145)

## 2016-07-17 LAB — GLUCOSE, CAPILLARY
GLUCOSE-CAPILLARY: 99 mg/dL (ref 65–99)
GLUCOSE-CAPILLARY: 92 mg/dL (ref 65–99)

## 2016-07-17 LAB — CBC
HCT: 24 % — ABNORMAL LOW (ref 39.0–52.0)
Hemoglobin: 8.1 g/dL — ABNORMAL LOW (ref 13.0–17.0)
MCH: 27.2 pg (ref 26.0–34.0)
MCHC: 33.8 g/dL (ref 30.0–36.0)
MCV: 80.5 fL (ref 78.0–100.0)
PLATELETS: 307 10*3/uL (ref 150–400)
RBC: 2.98 MIL/uL — AB (ref 4.22–5.81)
RDW: 15.4 % (ref 11.5–15.5)
WBC: 8.7 10*3/uL (ref 4.0–10.5)

## 2016-07-17 LAB — PROTIME-INR
INR: 2
Prothrombin Time: 23 seconds — ABNORMAL HIGH (ref 11.4–15.2)

## 2016-07-17 LAB — HEPARIN LEVEL (UNFRACTIONATED): HEPARIN UNFRACTIONATED: 0.46 [IU]/mL (ref 0.30–0.70)

## 2016-07-17 MED ORDER — ISOSORBIDE DINITRATE 20 MG PO TABS: 20.0000 mg | ORAL_TABLET | Freq: Three times a day (TID) | ORAL | 1 refills | Status: AC

## 2016-07-17 MED ORDER — OXYCODONE-ACETAMINOPHEN 5-325 MG PO TABS: 1.0000 | ORAL_TABLET | ORAL | 0 refills | Status: AC | PRN

## 2016-07-17 MED ORDER — WARFARIN SODIUM 5 MG PO TABS: 5.0000 mg | ORAL_TABLET | Freq: Every day | ORAL | 0 refills | Status: AC

## 2016-07-17 MED ORDER — DOXYCYCLINE HYCLATE 100 MG PO TABS: 100.0000 mg | ORAL_TABLET | Freq: Two times a day (BID) | ORAL | 0 refills | Status: AC

## 2016-07-17 MED ORDER — WARFARIN SODIUM 5 MG PO TABS: 7.5000 mg | ORAL_TABLET | Freq: Once | ORAL | Status: DC

## 2016-07-17 MED ORDER — LABETALOL HCL 200 MG PO TABS: 200.0000 mg | ORAL_TABLET | Freq: Three times a day (TID) | ORAL | 1 refills | Status: AC

## 2016-07-17 MED ORDER — HYDRALAZINE HCL 25 MG PO TABS: 37.5000 mg | ORAL_TABLET | Freq: Three times a day (TID) | ORAL | 1 refills | Status: AC

## 2016-07-17 MED ORDER — IPRATROPIUM-ALBUTEROL 0.5-2.5 (3) MG/3ML IN SOLN: 3.0000 mL | RESPIRATORY_TRACT | 1 refills | Status: AC | PRN

## 2016-07-17 NOTE — Progress Notes (Signed)
SATURATION QUALIFICATIONS: (This note is used to comply with regulatory documentation for home oxygen)  Patient Saturations on Room Air at Rest = 94%  Patient Saturations on Room Air while Ambulating = 91%   

## 2016-07-17 NOTE — Discharge Instructions (Signed)
Aortic Dissection °An aortic dissection is a tear in your aorta. The aorta is the main blood vessel that carries blood out of your heart to supply the rest of your body. It comes out of your heart and curves around, then goes down through your chest (thoracic aorta) and into your belly (abdominal aorta). The wall of the aorta has inner and outer layers. °Aortic dissection occurs most often in the thoracic aorta. This is more likely to happen if the inner layer of the aorta has a weak spot or gets injured. As the dissection widens and blood flows through it, the aorta becomes "double-barreled." This means that one part of the aorta continues to carry blood. However, the inner wall begins to separate from the rest of the aorta as blood flows through the tear. The torn part of the aorta fills with blood. It swells up like a balloon. This can reduce blood flow through the part of the aorta that is still working. Aortic dissection is a medical emergency. °CAUSES °Aortic dissection happens when there is a tear in the inner wall of the aorta. An injury or weakness can cause this tear. Sometimes the exact cause of the tear is not known. °RISK FACTORS °You may be at greater risk for aortic dissection if you: °· Have certain medical conditions, such as uncontrolled high blood pressure or atherosclerosis. °· Have a blunt injury to your chest. °· Have a genetic disorder that affects the connective tissue, such as Marfan syndrome, Turner syndrome, and Ehlers-Danlos syndrome. °· Are born with a problem that affects either your aorta or your heart valve. °· Have a condition that causes inflammation of blood vessels, such as giant cell arteritis. °· Are male. °· Are older than 69 years of age. °· Use cocaine. °· Smoke. °· Lift very heavy weights or do other types of high-intensity resistance training. °SIGNS AND SYMPTOMS  °Signs and symptoms of aortic dissection may start suddenly. Changes in position may make symptoms worse. The  most common symptoms are: °· Severe chest pain that may feel like a tearing, stabbing, or sharp pain. °· Pain that shifts to the shoulder, arm, neck, jaw, abdomen, or hips. °Other symptoms may include: °· Severe abdominal pain. °· Trouble breathing. °· Dizziness or fainting. °· Nausea or vomiting. °· Trouble swallowing. °· Sweating a lot. °· Feeling confused, dazed, anxious, or fearful. °DIAGNOSIS °Your health care provider may suspect aortic dissection based on your signs and symptoms and will perform a physical exam. During the physical exam, your health care provider may listen for abnormal blood flow sounds (murmurs) in your chest or your belly. You may also have your blood pressure checked to see whether it is low or whether there is a difference between the measurements in your arms and your legs. You may also have tests such as: °· Electrocardiogram (ECG). This is a test that measures the electrical activity in your heart. °· Chest X-ray. °· CT scan or MRI. °· Aortic angiogram. This test uses the injection of a dye to make it easier to see your blood vessels clearly. °· Echocardiogram to study your heart using sound waves. °TREATMENT °It is important to treat an aortic dissection as quickly as possible. Your treatment may start as soon as your health care provider suspects aortic dissection. Treatment will depend on how severe your dissection is, where it is located, and your overall health. Treatment options include: °· Medicines to lower your blood pressure. °· Surgery to remove the dissected part of   your aorta and replace it with a graft.  Medical procedures to thread long, thin tubes (catheters) into the aorta (endovascular procedures). This may be done to place a graft or a balloon in the blood vessel to improve blood flow or prevent further dissection. HOME CARE INSTRUCTIONS  Work with your health care provider to keep your blood pressure under control.  Avoid activities that could cause an  injury to your chest or your abdomen.  Do not smoke. If you need help quitting, ask your health care provider.  Do not participate in sports or exercises that involve lifting weights.  Keep all follow-up visits as directed by your health care provider. This is important. SEEK MEDICAL CARE IF:  You develop any new symptoms of aortic dissection after treatment. SEEK IMMEDIATE MEDICAL CARE IF:  You have severe pain in your chest or your abdomen.  You have trouble breathing. These symptoms may represent a serious problem that is an emergency. Do not wait to see if the symptoms will go away. Get medical help right away. Call your local emergency services (911 in the [place] Do not drive yourself to the hospital.   This information is not intended to replace advice given to you by your health care provider. Make sure you discuss any questions you have with your health care provider.   Document Released: 11/29/2007 Document Revised: 09/12/2014 Document Reviewed: 04/02/2014 Elsevier Interactive Patient Education [employer]2016 Elsevier Inc.   Information on my medicine - Coumadin   (Warfarin)  This medication education was reviewed with me or my healthcare representative as part of my discharge preparation.  The pharmacist that spoke with me during my hospital stay was:  Chinita GreenlandJohnston, Trinidad Petron Faye, [facility]  Why was Coumadin prescribed for you? Coumadin was prescribed for you because you have a blood clot or a medical condition that can cause an increased risk of forming blood clots. Blood clots can cause serious health problems by blocking the flow of blood to the heart, lung, or brain. Coumadin can prevent harmful blood clots from forming. As a reminder your indication for Coumadin is:   Deep Vein Thrombosis Treatment  What test will check on my response to Coumadin? While on Coumadin (warfarin) you will need to have an INR test regularly to ensure that your dose is keeping you in the desired range. The INR  (international normalized ratio) number is calculated from the result of the laboratory test called prothrombin time (PT).  If an INR APPOINTMENT HAS NOT ALREADY BEEN MADE FOR YOU please schedule an appointment to have this lab work done by your health care provider within 7 days. Your INR goal is usually a number between:  2 to 3 or your provider may give you a more narrow range like 2-2.5.  Ask your health care provider during an office visit what your goal INR is.  What  do you need to  know  About  COUMADIN? Take Coumadin (warfarin) exactly as prescribed by your healthcare provider about the same time each day.  DO NOT stop taking without talking to the doctor who prescribed the medication.  Stopping without other blood clot prevention medication to take the place of Coumadin may increase your risk of developing a new clot or stroke.  Get refills before you run out.  What do you do if you miss a dose? If you miss a dose, take it as soon as you remember on the same day then continue your regularly scheduled regimen the next day.  Do not take two doses of Coumadin at the same time.  Important Safety Information A possible side effect of Coumadin (Warfarin) is an increased risk of bleeding. You should call your healthcare provider right away if you experience any of the following: ? Bleeding from an injury or your nose that does not stop. ? Unusual colored urine (red or dark brown) or unusual colored stools (red or black). ? Unusual bruising for unknown reasons. ? A serious fall or if you hit your head (even if there is no bleeding).  Some foods or medicines interact with Coumadin (warfarin) and might alter your response to warfarin. To help avoid this: ? Eat a balanced diet, maintaining a consistent amount of Vitamin K. ? Notify your provider about major diet changes you plan to make. ? Avoid alcohol or limit your intake to 1 drink for women and 2 drinks for men per day. (1 drink is 5 oz.  wine, 12 oz. beer, or 1.5 oz. liquor.)  Make sure that ANY health care provider who prescribes medication for you knows that you are taking Coumadin (warfarin).  Also make sure the healthcare provider who is monitoring your Coumadin knows when you have started a new medication including herbals and non-prescription products.  Coumadin (Warfarin)  Major Drug Interactions  Increased Warfarin Effect Decreased Warfarin Effect  Alcohol (large quantities) Antibiotics (esp. Septra/Bactrim, Flagyl, Cipro) Amiodarone (Cordarone) Aspirin (ASA) Cimetidine (Tagamet) Megestrol (Megace) NSAIDs (ibuprofen, naproxen, etc.) Piroxicam (Feldene) Propafenone (Rythmol SR) Propranolol (Inderal) Isoniazid (INH) Posaconazole (Noxafil) Barbiturates (Phenobarbital) Carbamazepine (Tegretol) Chlordiazepoxide (Librium) Cholestyramine (Questran) Griseofulvin Oral Contraceptives Rifampin Sucralfate (Carafate) Vitamin K   Coumadin (Warfarin) Major Herbal Interactions  Increased Warfarin Effect Decreased Warfarin Effect  Garlic Ginseng Ginkgo biloba Coenzyme Q10 Green tea St. Johns wort    Coumadin (Warfarin) FOOD Interactions  Eat a consistent number of servings per week of foods HIGH in Vitamin K (1 serving =  cup)  Collards (cooked, or boiled & drained) Kale (cooked, or boiled & drained) Mustard greens (cooked, or boiled & drained) Parsley *serving size only =  cup Spinach (cooked, or boiled & drained) Swiss chard (cooked, or boiled & drained) Turnip greens (cooked, or boiled & drained)  Eat a consistent number of servings per week of foods MEDIUM-HIGH in Vitamin K (1 serving = 1 cup)  Asparagus (cooked, or boiled & drained) Broccoli (cooked, boiled & drained, or raw & chopped) Brussel sprouts (cooked, or boiled & drained) *serving size only =  cup Lettuce, raw (green leaf, endive, romaine) Spinach, raw Turnip greens, raw & chopped   These websites have more information on Coumadin  (warfarin):  http://www.king-russell.com/www.coumadin.com; https://www.hines.net/www.ahrq.gov/consumer/coumadin.htm;

## 2016-07-17 NOTE — Progress Notes (Addendum)
ANTICOAGULATION CONSULT NOTE - Follow Up Consult  Pharmacy Consult for Heparin and Coumadin Indication: DVT, newly discovered but may be chronic per note  Assessment: 70 yo male with uncontrolled hypertension and history of cardiac surgery and AAA repair (11/2015) found to have DVT in the left femoral vein.  Heparin level therapeutic (0.46) today on 1800 units/hr.  INR now therapeutice 1.1>1.4>2.0. Today is overlap day #5 of 5 minimum. Albumin low at 2.4, not eating full meals, may be sensitive to Coumadin. Currently planning Coumadin for 3 months, then follow-up study.   Goal of Therapy:  INR 2-3 Heparin level 0.3-0.7 units/ml Monitor platelets by anticoagulation protocol: Yes   Plan:   Continue heparin drip at 1800 units/hr or overlap with LMWH x 1 day if planning to discharge  Coumadin with 7.5 mg x 1 today.  Daily heparin level, CBC, PT/INR.  Discharge Planning:  Consider 5mg  Warfarin daily with repeat INR next week                                     Consider 1 day of Lovenox overlap at 90mg  every 12hr x 2   Drug/Drug Interaction: Allopurinol and Warfarin = inc. PT/INR - monitor closely as Allopurinol prolongs the elimination half life of dicumarol.  No Known Allergies  Patient Measurements: Height: 6\' 1"  (185.4 cm) Weight: 199 lb 15.3 oz (90.7 kg) IBW/kg (Calculated) : 79.9 Heparin Dosing Weight: 96.5 kg  Vital Signs: Temp: 97.6 F (36.4 C) (11/12 0537) Temp Source: Oral (11/12 0537) BP: 118/67 (11/12 0537) Pulse Rate: 70 (11/12 0537)  Labs:  Recent Labs  07/15/16 0352 07/16/16 0425 07/17/16 0448  HGB 8.7* 8.2* 8.1*  HCT 25.6* 24.0* 24.0*  PLT 279 296 307  LABPROT 14.3 17.7* 23.0*  INR 1.11 1.44 2.00  HEPARINUNFRC 0.47 0.42 0.46  CREATININE 3.52* 3.92* 4.27*    Estimated Creatinine Clearance: 18.5 mL/min (by C-G formula based on SCr of 4.27 mg/dL (H)).  Nadara MustardNita Fredricka Kohrs, PharmD., MS Clinical Pharmacist Pager:  [phone] Thank you for allowing pharmacy  to be part of this patients care team. 11/12/201710:03 AM

## 2016-07-17 NOTE — Care Management (Addendum)
CM contacted [facility]. Faxed home health order, H&P and face sheet [facility] [phone]) per CM's note on 07/14/16. Physicist, medicalCrystal Anett Ranker RN

## 2016-07-17 NOTE — Progress Notes (Signed)
Patient spouse called with concerns that patient is not ready for discharge. Dr made aware and talked with spouse on phone

## 2016-07-17 NOTE — Discharge Summary (Signed)
Physician Discharge Summary  Mario Mccann ZOX:[id number] DOB: [birth year] DOA: 07/08/2016  PCP: No primary care provider on file.  Admit date: 07/08/2016 Discharge date: 07/17/2016  Recommendations for Outpatient Follow-up:  1. Pt will need to follow up with PCP in 1-2 weeks post discharge 2. Please obtain BMP to evaluate electrolytes and kidney function 3. Please also check CBC to evaluate Hg and Hct levels 4. Pt to complete therapy with doxycycline for 5 more days post discharge 5. Please note that pt was discharged on Coumadin and needs to have PT/INR checked in next week to ensure correct and appropriate dose of Coumadin  6. Please also note that pt has an appointment scheduled with Dr. Eliott Nine, information below and pt was made aware he needs to follow up with Dr. Eliott Nine as his Cr has been above his baseline  7. Please note the change in BP medications outlined below, major change being stopping Coreg and placing pt on Labetalol 8. Imdur and Losartan also held, pt instead placed on Isordil, not to resume ACEI/ARB until renal function stabilizes 9. Plavix has also been discontinued as pt is now on Coumadin 10. Pt has been discharged with oxygen to keep on at 2 L via [place]   Discharge Diagnoses:  Active Problems:   Thoracic aortic dissection (HCC)   Renal insufficiency   AKI (acute kidney injury) (HCC)   Acute renal failure superimposed on stage 3 chronic kidney disease (HCC)   Aortic dissection, thoracic (HCC)   DVT of deep femoral vein, left (HCC)  Discharge Condition: Stable  Diet recommendation: Heart healthy diet discussed in details   Brief Narrative:  70 yo M Hx HTN, CAD, S/P AAA repair, DM2, and CKD stage III who developed severe back pain. He came to the ER and was noted to have BP 230/137. His CXR showed tortuous aorta. MRA chest revealed a type B aortic dissection and infrarenal AAA.  He recently moved to [place] from [place] does not have a local  MD.  He is a Sales executive.  Significant Events: 11/3 Admit 11/4 added nicardipine as esmolol maxed out. Asked vascular to see 11/5 esmolol stopped d/t nausea. BP not at goal. Creatinine worse. Changed BP goal to <120. Added labetolol Cont nicardipine. Lasix given x 1. 11/5 TTE EF 55-60%, moderate LVH, no wall motion abnormalities, mildly calcified AV, mildy dilated ascending aorta at 3.7cm 11/6 DVT identified via doppler and anticoagulation started   Subjective: The patient is resting comfortably in a bedside chair.  He denies chest pain shortness breath fevers chills nausea or vomiting.  He is quite pleasant.  He is anxious to be discharged home as soon as possible, wants to really leave today.   Assessment & Plan:  Type B Thoracic Aortic dissection Strict blood pressure control indicated BP currently stable  Please note the blood pressure medication changes outlined below   Persistent dyspnea with hypoxia and requiring oxygen supplementation while hospitalized  - secondary to ? LLL PNA, pt is however afebrile and with stable WBC, able to take off oxygen today  - will place on empiric ABX to complete therapy for 5 days post discharge   HTN emergency Emergency resolved, BP has been stable on current medical regimen   PAD with infrarenal AAA S/p endovascular repair in [facility] following, outpatient follow up  DVT left femoral vein - acute v/s chronic  Continue Coumadin INR therapeutic  CAD s/p CABG  Asymptomatic  DM2 with complications of nephropathy  Well-controlled at present but Cr is up a bit in the past 24 hours  HLD Continue Lipitor   Acute on CKD 3 baseline Cr ~2.6 to 3.0 (per records from [facility])  outpt eval w/ Dr. Eliott Nine at [facility] has been arranged on Nov 30 at 3:30PM  Polycystic kidney disease?  Hyponatremia Corrected  DVT prophylaxis: IV heparin > coumadin  Code Status: FULL CODE Family  Communication: no family present at time of exam but wife was updated on several occasions throughout the day on her cell phone  Disposition Plan: d/c home   Consultants:  Vasc Surgery PCCM  Procedures/Studies: Ct Abdomen Pelvis Wo Contrast  Result Date: 07/11/2016 CLINICAL DATA:  70 year old male recently diagnosed with abdominal aortic aneurysm. Followup study. EXAM: CT CHEST, ABDOMEN AND PELVIS WITHOUT CONTRAST TECHNIQUE: Multidetector CT imaging of the chest, abdomen and pelvis was performed following the standard protocol without IV contrast. COMPARISON:  No priors. MRI of the thorax without gadolinium 07/08/2016. FINDINGS: CT CHEST FINDINGS Cardiovascular: Heart size is borderline enlarged. There is no significant pericardial fluid, thickening or pericardial calcification. There is aortic atherosclerosis, as well as atherosclerosis of the great vessels of the mediastinum and the coronary arteries, including calcified atherosclerotic plaque in the left main, left anterior descending, left circumflex and right coronary arteries. Status post median sternotomy for CABG, including LIMA to the LAD. Throughout much of the descending thoracic aorta there is crescentic high attenuation associated with the posterior and left lateral wall of the descending thoracic aorta. This is poorly evaluated on today's noncontrast examination. Mediastinum/Nodes: No pathologically enlarged mediastinal or hilar lymph nodes. Please note that accurate exclusion of hilar adenopathy is limited on noncontrast CT scans. Esophagus is unremarkable in appearance. No axillary lymphadenopathy. Lungs/Pleura: Moderate centrilobular and mild paraseptal emphysema. Small bilateral low-attenuation pleural effusions layer dependently. Some associated passive subsegmental atelectasis in the lower lobes of the lungs bilaterally. No definite acute consolidative airspace disease. No definite suspicious appearing pulmonary nodules or masses.  Musculoskeletal: Median sternotomy wires. There are no aggressive appearing lytic or blastic lesions noted in the visualized portions of the skeleton. CT ABDOMEN PELVIS FINDINGS Hepatobiliary: There are innumerable low-attenuation lesions scattered throughout the liver, incompletely characterized on today's noncontrast CT examination, but statistically likely multiple cysts. Gallbladder appears decompressed. Pancreas: No definite pancreatic mass or peripancreatic inflammatory changes on today's noncontrast CT examination. Spleen: Unremarkable. Adrenals/Urinary Tract: Throughout both kidneys there are innumerable low, intermediate and high attenuation lesions which are incompletely characterized on today's noncontrast CT examination, but presumably represent a combination of simple cysts and complex cysts, presumably in the setting of polycystic kidney disease. Some of these cysts are located in the parapelvic regions. No hydroureteronephrosis. Urinary bladder is nearly completely decompressed around an indwelling Foley catheter, with a small amount of gas non dependently in the lumen of the urinary bladder. Adrenal glands are unremarkable in appearance bilaterally. Stomach/Bowel: Unenhanced appearance of the stomach is normal. There is no pathologic dilatation of small bowel or colon. The appendix is not confidently identified and may be surgically absent. Regardless, there are no inflammatory changes noted adjacent to the cecum to suggest the presence of an acute appendicitis at this time. Vascular/Lymphatic: Fusiform infrarenal abdominal aortic aneurysm measuring up to 5.9 x 4.9 cm (axial image 208 of series 7). 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 The patency of these grafts cannot be assessed on today's noncontrast examination, nor can accurate assessment for endoleak be assessed. There is also aneurysmal dilatation  of the common iliac arteries bilaterally which measure up to 3.4 cm in  diameter on the right and 2.1 cm in diameter on the left. No lymphadenopathy noted in the abdomen or pelvis on today's noncontrast CT examination. Reproductive: Prostate gland and seminal vesicles are unremarkable in appearance. Other: Small volume of ascites predominantly in the low anatomic pelvis and in the left paracolic gutter. No pneumoperitoneum. Musculoskeletal: There are no aggressive appearing lytic or blastic lesions noted in the visualized portions of the skeleton. IMPRESSION: 1. Fusiform infrarenal abdominal aortic aneurysm measuring up to 5.9 x 4.9 cm. Vascular surgery consultation recommended (if not are the performed) due to increased risk of rupture for AAA >5.5 cm. This recommendation follows ACR consensus guidelines: White Paper of the ACR Incidental Findings Committee II on Vascular Findings. J Am Coll Radiol 2013; 10:789-794. 2. Recently recognized type B dissection is not well demonstrated on today's noncontrast CT examination. However, there is considerable crescentic high attenuation associated with the posterior and left lateral wall of the descending thoracic aorta in the distribution of the recently recognized type B dissection. This is favored to represent extensive partial thrombosis of the false lumen rather than evidence of recent development of intramural hematoma. 3. Stigmata of autosomal dominant polycystic kidney disease with innumerable cystic lesions in the kidneys and liver, as above. 4. Small volume of ascites. 5. Additional incidental findings, as above. Electronically Signed   By: Trudie Reed M.D.   On: 07/11/2016 11:20   Ct Chest Without Contrast  Result Date: 07/11/2016 CLINICAL DATA:  70 year old male recently diagnosed with abdominal aortic aneurysm. Followup study. EXAM: CT CHEST, ABDOMEN AND PELVIS WITHOUT CONTRAST TECHNIQUE: Multidetector CT imaging of the chest, abdomen and pelvis was performed following the standard protocol without IV contrast. COMPARISON:   No priors. MRI of the thorax without gadolinium 07/08/2016. FINDINGS: CT CHEST FINDINGS Cardiovascular: Heart size is borderline enlarged. There is no significant pericardial fluid, thickening or pericardial calcification. There is aortic atherosclerosis, as well as atherosclerosis of the great vessels of the mediastinum and the coronary arteries, including calcified atherosclerotic plaque in the left main, left anterior descending, left circumflex and right coronary arteries. Status post median sternotomy for CABG, including LIMA to the LAD. Throughout much of the descending thoracic aorta there is crescentic high attenuation associated with the posterior and left lateral wall of the descending thoracic aorta. This is poorly evaluated on today's noncontrast examination. Mediastinum/Nodes: No pathologically enlarged mediastinal or hilar lymph nodes. Please note that accurate exclusion of hilar adenopathy is limited on noncontrast CT scans. Esophagus is unremarkable in appearance. No axillary lymphadenopathy. Lungs/Pleura: Moderate centrilobular and mild paraseptal emphysema. Small bilateral low-attenuation pleural effusions layer dependently. Some associated passive subsegmental atelectasis in the lower lobes of the lungs bilaterally. No definite acute consolidative airspace disease. No definite suspicious appearing pulmonary nodules or masses. Musculoskeletal: Median sternotomy wires. There are no aggressive appearing lytic or blastic lesions noted in the visualized portions of the skeleton. CT ABDOMEN PELVIS FINDINGS Hepatobiliary: There are innumerable low-attenuation lesions scattered throughout the liver, incompletely characterized on today's noncontrast CT examination, but statistically likely multiple cysts. Gallbladder appears decompressed. Pancreas: No definite pancreatic mass or peripancreatic inflammatory changes on today's noncontrast CT examination. Spleen: Unremarkable. Adrenals/Urinary Tract:  Throughout both kidneys there are innumerable low, intermediate and high attenuation lesions which are incompletely characterized on today's noncontrast CT examination, but presumably represent a combination of simple cysts and complex cysts, presumably in the setting of polycystic kidney disease. Some of these cysts  are located in the parapelvic regions. No hydroureteronephrosis. Urinary bladder is nearly completely decompressed around an indwelling Foley catheter, with a small amount of gas non dependently in the lumen of the urinary bladder. Adrenal glands are unremarkable in appearance bilaterally. Stomach/Bowel: Unenhanced appearance of the stomach is normal. There is no pathologic dilatation of small bowel or colon. The appendix is not confidently identified and may be surgically absent. Regardless, there are no inflammatory changes noted adjacent to the cecum to suggest the presence of an acute appendicitis at this time. Vascular/Lymphatic: Fusiform infrarenal abdominal aortic aneurysm measuring up to 5.9 x 4.9 cm (axial image 208 of series 7). 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 The patency of these grafts cannot be assessed on today's noncontrast examination, nor can accurate assessment for endoleak be assessed. There is also aneurysmal dilatation of the common iliac arteries bilaterally which measure up to 3.4 cm in diameter on the right and 2.1 cm in diameter on the left. No lymphadenopathy noted in the abdomen or pelvis on today's noncontrast CT examination. Reproductive: Prostate gland and seminal vesicles are unremarkable in appearance. Other: Small volume of ascites predominantly in the low anatomic pelvis and in the left paracolic gutter. No pneumoperitoneum. Musculoskeletal: There are no aggressive appearing lytic or blastic lesions noted in the visualized portions of the skeleton. IMPRESSION: 1. Fusiform infrarenal abdominal aortic aneurysm measuring up to 5.9 x 4.9 cm.  Vascular surgery consultation recommended (if not are the performed) due to increased risk of rupture for AAA >5.5 cm. This recommendation follows ACR consensus guidelines: White Paper of the ACR Incidental Findings Committee II on Vascular Findings. J Am Coll Radiol 2013; 10:789-794. 2. Recently recognized type B dissection is not well demonstrated on today's noncontrast CT examination. However, there is considerable crescentic high attenuation associated with the posterior and left lateral wall of the descending thoracic aorta in the distribution of the recently recognized type B dissection. This is favored to represent extensive partial thrombosis of the false lumen rather than evidence of recent development of intramural hematoma. 3. Stigmata of autosomal dominant polycystic kidney disease with innumerable cystic lesions in the kidneys and liver, as above. 4. Small volume of ascites. 5. Additional incidental findings, as above. Electronically Signed   By: Trudie Reedaniel  Entrikin M.D.   On: 07/11/2016 11:20   Mr Maxine GlennMra Chest Wo Contrast  Result Date: 07/08/2016 CLINICAL DATA:  Severe back pain and hypertension. EXAM: MRA CHEST WITH OR WITHOUT CONTRAST TECHNIQUE: Angiographic images of the chest were obtained using MRA technique without and with intravenous contrast. CONTRAST:  None COMPARISON:  Chest radiograph 07/08/2016 FINDINGS: There is a type B dissection of the thoracic aorta, beginning just beyond the origin of the left subclavian artery and continuing throughout the descending thoracic aorta. Normal caliber of the stent aorta and arch through the great vessel origins. Great vessel origins are patent. The dissection appears to end just above the diaphragmatic hiatus. No pleural effusions. At the bottom of this study, there is a strong suggestion of an infrarenal abdominal aortic aneurysm which could measure over 5 cm. This is poorly seen and the images at this level are degraded by motion artifact. Innumerable  cysts throughout the liver and both kidneys, incompletely imaged. IMPRESSION: 1. Type B thoracic aortic dissection extending to just above the diaphragmatic hiatus. 2. Probable infrarenal abdominal aortic aneurysm, possibly over 5 cm. 3. Normal caliber of the ascending aorta and arch through the great vessel origins. Great vessel origins are patent. 4.  These results will be called to the ordering clinician or representative by the Radiologist Assistant, and communication documented in the PACS or zVision Dashboard. Electronically Signed   By: Ellery Plunk M.D.   On: 07/08/2016 22:53   Dg Chest Port 1 View  Result Date: 07/14/2016 CLINICAL DATA:  Respiratory failure.  Shortness of breath. EXAM: PORTABLE CHEST 1 VIEW COMPARISON:  07/13/2016 FINDINGS: Prior sternotomy and aortic atherosclerosis are again noted. Cardiac silhouette is normal in size. Left basilar density is unchanged and reflects a combination of small pleural effusion and parenchymal lung opacity. The right lung remains clear. No pneumothorax is seen. IMPRESSION: 1. Unchanged small left pleural effusion and left lower lobe atelectasis. 2. Aortic atherosclerosis. Electronically Signed   By: Sebastian Ache M.D.   On: 07/14/2016 08:07   Dg Chest Port 1 View  Result Date: 07/13/2016 CLINICAL DATA:  Pulmonary edema.  Shortness of breath.  Wheezing. EXAM: PORTABLE CHEST 1 VIEW COMPARISON:  09/08/2015 FINDINGS: The heart size and pulmonary vascularity are normal. Tortuosity and calcification of the thoracic aorta. New small left effusion and left base atelectasis. Right lung is clear. IMPRESSION: New small left pleural Electronically Signed   By: Francene Boyers M.D.   On: 07/13/2016 12:15   Dg Chest Portable 1 View  Result Date: 07/08/2016 CLINICAL DATA:  Severe back pain EXAM: PORTABLE CHEST 1 VIEW COMPARISON:  None. FINDINGS: Cardiac shadow is at the upper limits of normal in size. Postsurgical changes are seen. Tortuosity of the thoracic  aorta is noted. Aortic calcifications are seen. No acute bony abnormality is noted. The lungs are clear. IMPRESSION: Tortuous thoracic aorta. Given the patient's history the possibility of dissection deserves consideration. Electronically Signed   By: Alcide Clever M.D.   On: 07/08/2016 20:16    Discharge Exam: Vitals:   07/16/16 2332 07/17/16 0537  BP: 104/64 118/67  Pulse: 68 70  Resp:    Temp: 98.7 F (37.1 C) 97.6 F (36.4 C)   Vitals:   07/16/16 1722 07/16/16 2107 07/16/16 2332 07/17/16 0537  BP: 106/65 121/68 104/64 118/67  Pulse: 66 75 68 70  Resp:      Temp:  97.8 F (36.6 C) 98.7 F (37.1 C) 97.6 F (36.4 C)  TempSrc:  Oral Oral Oral  SpO2:  93% 98% 95%  Weight:      Height:        General: Pt is alert, follows commands appropriately, not in acute distress Cardiovascular: Regular rate and rhythm, no rubs, no gallops Respiratory: Clear to auscultation bilaterally, no wheezing, diminished breath sounds at bases  Abdominal: Soft, non tender, non distended, bowel sounds +, no guarding  Discharge Instructions     Medication List    STOP taking these medications   carvedilol 25 MG tablet Commonly known as:  COREG   clopidogrel 75 MG tablet Commonly known as:  PLAVIX   ezetimibe-simvastatin 10-20 MG tablet Commonly known as:  VYTORIN   isosorbide mononitrate 60 MG 24 hr tablet Commonly known as:  IMDUR   losartan 25 MG tablet Commonly known as:  COZAAR     TAKE these medications   allopurinol 100 MG tablet Commonly known as:  ZYLOPRIM Take 100 mg by mouth daily.   amLODipine 10 MG tablet Commonly known as:  NORVASC Take 10 mg by mouth daily.   atorvastatin 80 MG tablet Commonly known as:  LIPITOR Take 80 mg by mouth daily.   doxycycline 100 MG tablet Commonly known as:  VIBRA-TABS Take 1  tablet (100 mg total) by mouth 2 (two) times daily.   ezetimibe 10 MG tablet Commonly known as:  ZETIA Take 10 mg by mouth daily.   finasteride 5 MG  tablet Commonly known as:  PROSCAR Take 5 mg by mouth daily.   hydrALAZINE 25 MG tablet Commonly known as:  APRESOLINE Take 1.5 tablets (37.5 mg total) by mouth every 8 (eight) hours.   ipratropium-albuterol 0.5-2.5 (3) MG/3ML Soln Commonly known as:  DUONEB Take 3 mLs by nebulization every 4 (four) hours as needed.   isosorbide dinitrate 20 MG tablet Commonly known as:  ISORDIL Take 1 tablet (20 mg total) by mouth 3 (three) times daily.   labetalol 200 MG tablet Commonly known as:  NORMODYNE Take 1 tablet (200 mg total) by mouth 3 (three) times daily.   oxybutynin 10 MG 24 hr tablet Commonly known as:  DITROPAN-XL Take 10 mg by mouth at bedtime.   oxyCODONE-acetaminophen 5-325 MG tablet Commonly known as:  PERCOCET/ROXICET Take 1 tablet by mouth every 4 (four) hours as needed for moderate pain.   Vitamin D 2000 units tablet Take 2,000 Units by mouth daily.   warfarin 5 MG tablet Commonly known as:  COUMADIN Take 1 tablet (5 mg total) by mouth daily. Please see primary doctor to have PT/INR check within next week.       Follow-up Information    [facility]. Go on 07/22/2016.   Why:  at 12:20pm Contact information: [address], [place], [place] [address]  [phone]           The results of significant diagnostics from this hospitalization (including imaging, microbiology, ancillary and laboratory) are listed below for reference.     Microbiology: Recent Results (from the past 240 hour(s))  MRSA PCR Screening     Status: None   Collection Time: 07/09/16  1:00 AM  Result Value Ref Range Status   MRSA by PCR NEGATIVE NEGATIVE Final    Comment:        The GeneXpert MRSA Assay (FDA approved for NASAL specimens only), is one component of a comprehensive MRSA colonization surveillance program. It is not intended to diagnose MRSA infection nor to guide or monitor treatment for MRSA infections.      Labs: Basic Metabolic Panel:  Recent  Labs Lab 07/11/16 0410 07/12/16 0116 07/13/16 0252 07/14/16 0305 07/15/16 0352 07/16/16 0425 07/17/16 0448  NA 135 135 134* 137 136 136 137  K 4.0 4.4 4.6 4.6 4.3 4.4 4.4  CL 107 109 108 106 106 107 108  CO2 20* 19* 17* 20* 20* 16* 17*  GLUCOSE 103* 98 116* 118* 103* 97 98  BUN 42* 48* 48* 50* 53* 61* 68*  CREATININE 3.75* 3.72* 3.48* 3.41* 3.52* 3.92* 4.27*  CALCIUM 7.7* 7.9* 7.8* 8.5* 8.4* 8.4* 8.7*  MG 1.6* 2.1  --   --  2.4  --   --   PHOS 4.2 4.2 4.7* 5.7*  --   --   --    Liver Function Tests:  Recent Labs Lab 07/11/16 0410 07/12/16 0116  AST 11* 12*  ALT 11* 9*  ALKPHOS 49 52  BILITOT 0.6 0.4  PROT 5.9* 6.2*  ALBUMIN 2.5* 2.4*   CBC:  Recent Labs Lab 07/13/16 0252 07/14/16 0305 07/15/16 0352 07/16/16 0425 07/17/16 0448  WBC 9.5 8.9 8.4 7.9 8.7  HGB 7.8* 8.8* 8.7* 8.2* 8.1*  HCT 23.0* 26.1* 25.6* 24.0* 24.0*  MCV 79.9 79.8 79.8 79.7 80.5  PLT 233 268  279 296 307   Cardiac Enzymes:  Recent Labs Lab 07/11/16 0410 07/11/16 1100 07/11/16 1606 07/11/16 2218  TROPONINI 0.11* 0.09* 0.07* 0.07*   CBG:  Recent Labs Lab 07/16/16 0828 07/16/16 1154 07/16/16 1720 07/16/16 2109 07/17/16 0814  GLUCAP 100* 106* 106* 112* 99     SIGNED: Time coordinating discharge: 30 minutes  MAGICK-Angela Vazguez, MD  [facility] 07/17/2016, 8:37 AM Pager [phone]  If 7PM-7AM, please contact night-coverage [email or web address] Password [email or web address]

## 2016-07-17 NOTE — Progress Notes (Signed)
Discharge instructions given and patient verbalized understanding. 

## 2016-07-17 NOTE — Progress Notes (Signed)
Reviewed CXR, added doxycycline to pt's medical list to complete ABX for 5 more days post discharge.   Debbora PrestoMAGICK-Natia Fahmy, MD  [facility] Pager [phone]  If 7PM-7AM, please contact night-coverage [email or web address] Password [email or web address]

## 2016-10-13 ENCOUNTER — Encounter: Payer: Self-pay | Admitting: Vascular Surgery

## 2016-10-19 ENCOUNTER — Ambulatory Visit: Payer: Medicare Other | Admitting: Vascular Surgery

## 2016-10-19 ENCOUNTER — Ambulatory Visit (HOSPITAL_COMMUNITY): Payer: Medicare Other

## 2017-01-11 ENCOUNTER — Ambulatory Visit: Payer: Medicare Other | Admitting: Vascular Surgery

## 2017-01-11 ENCOUNTER — Encounter (HOSPITAL_COMMUNITY): Payer: Medicare Other

## 2017-01-11 ENCOUNTER — Other Ambulatory Visit (HOSPITAL_COMMUNITY): Payer: Medicare Other

## 2017-01-25 ENCOUNTER — Ambulatory Visit: Payer: Medicare Other | Admitting: Vascular Surgery

## 2017-01-25 ENCOUNTER — Encounter (HOSPITAL_COMMUNITY): Payer: Medicare Other

## 2018-04-09 IMAGING — CT CT CHEST W/O CM
2 of 4 series · 12 of 46 positions shown, 14 images · non-contrast
Comparison: No priors. MRI of the thorax without gadolinium
07/08/2016.

CLINICAL DATA: 70-year-old male recently diagnosed with abdominal
aortic aneurysm. Followup study.

EXAM:
CT CHEST, ABDOMEN AND PELVIS WITHOUT CONTRAST
TECHNIQUE: Multidetector CT imaging of the chest, abdomen and pelvis was
performed following the standard protocol without IV contrast.

[Series 4: cap w/o 3.0 mm st cor · coronal · non-contrast · 0.68mm/px · 3 of 117 slices shown]
[im 39/117  soft-tissue]
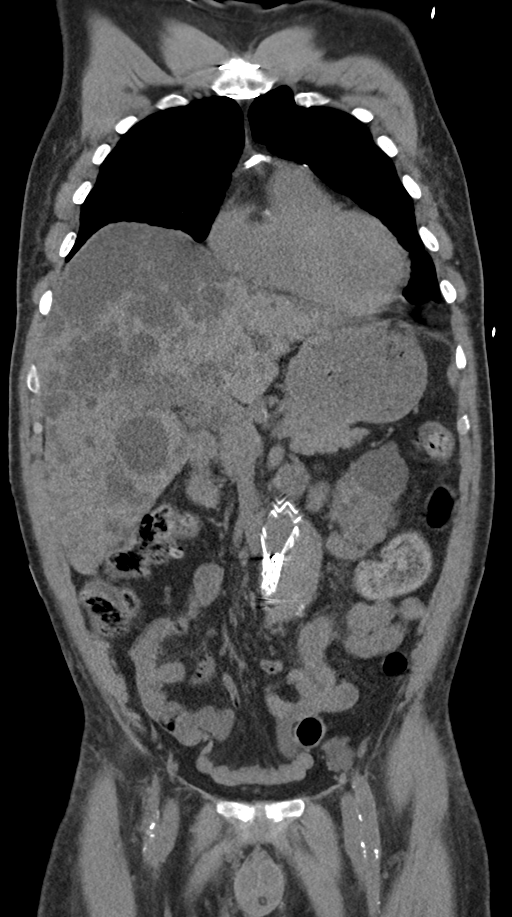
[im 52/117  soft-tissue]
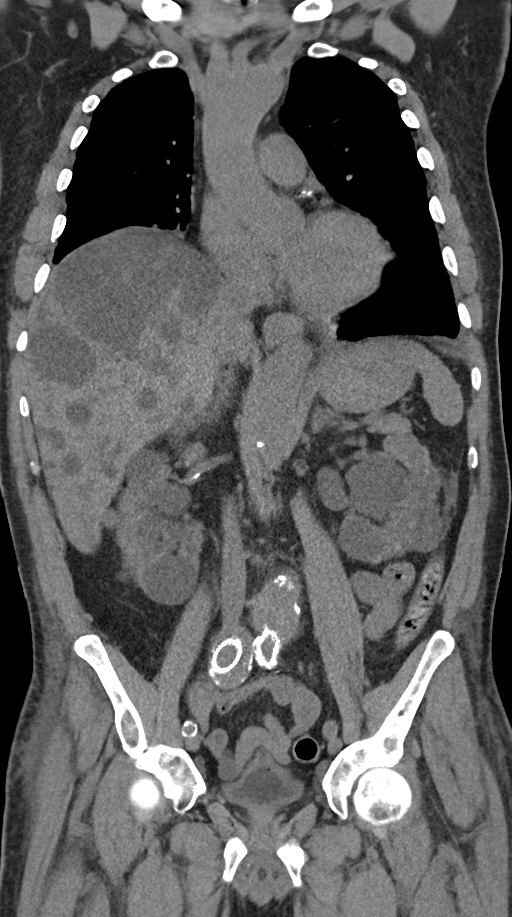
[im 65/117  soft-tissue]
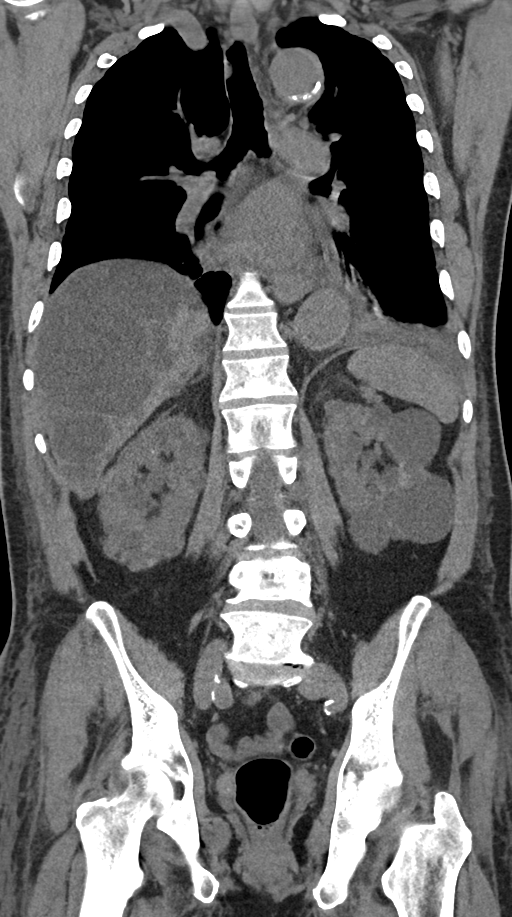

[Series 7: cap w/o 2.0 mm st · axial · non-contrast · 0.68mm/px · z∈[+1056,+1586]mm · 9 of 325 slices shown, 11 images]
[im 30/325  soft-tissue]
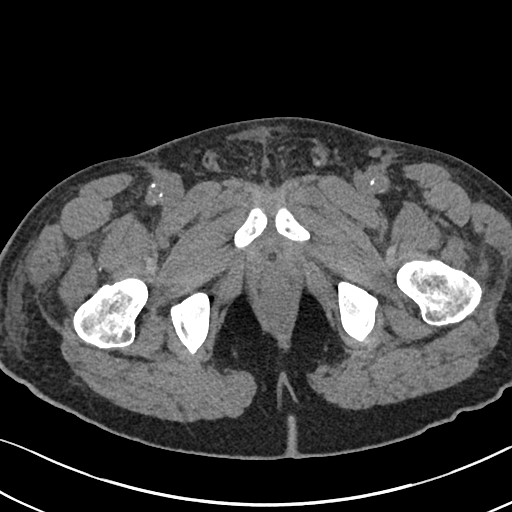
[im 30/325  bone]
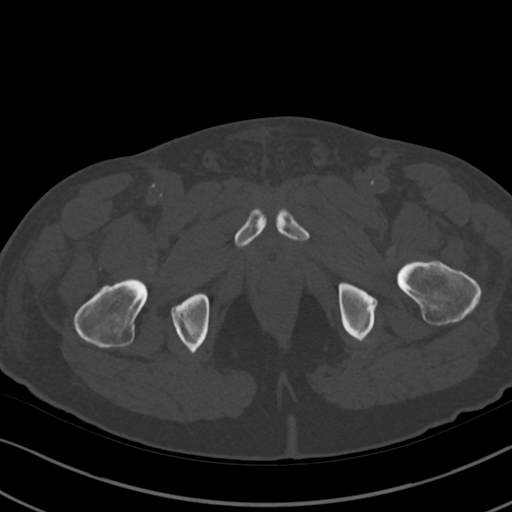
[im 59/325  soft-tissue]
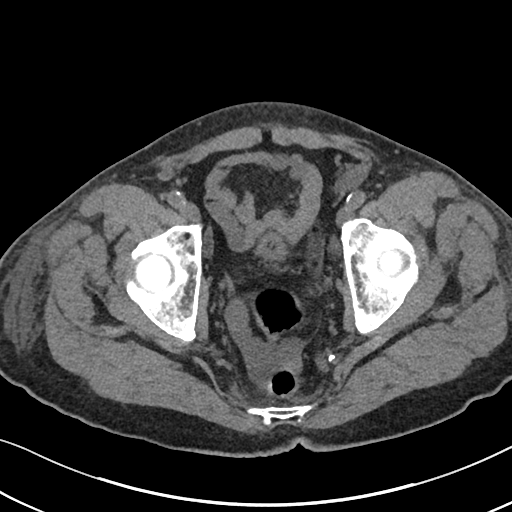
[im 89/325  soft-tissue]
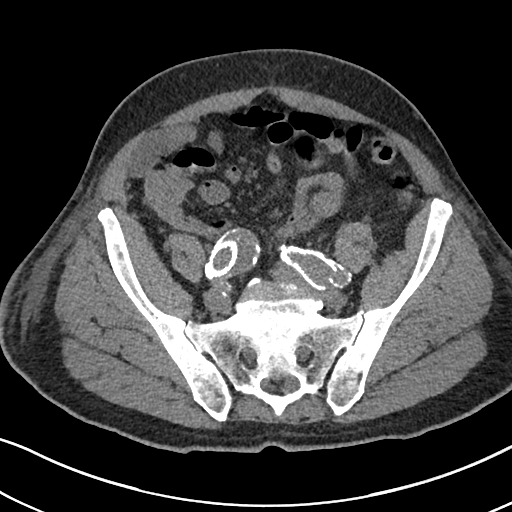
[im 133/325  soft-tissue]
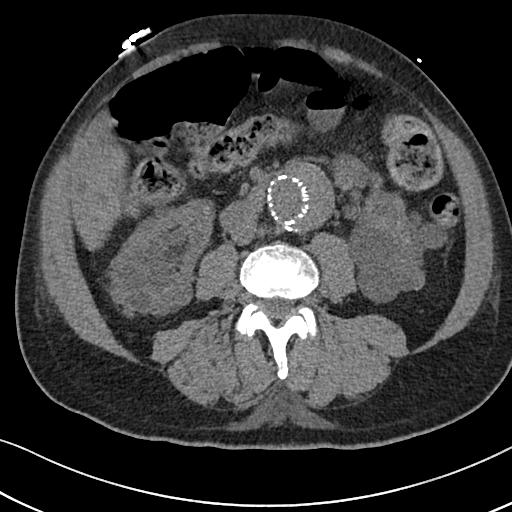
[im 163/325  soft-tissue]
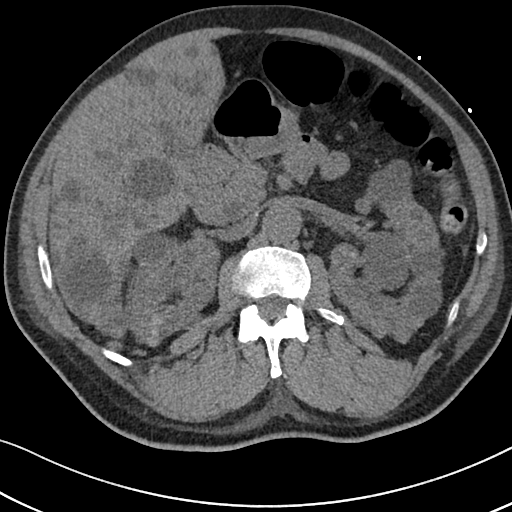
[im 192/325  soft-tissue]
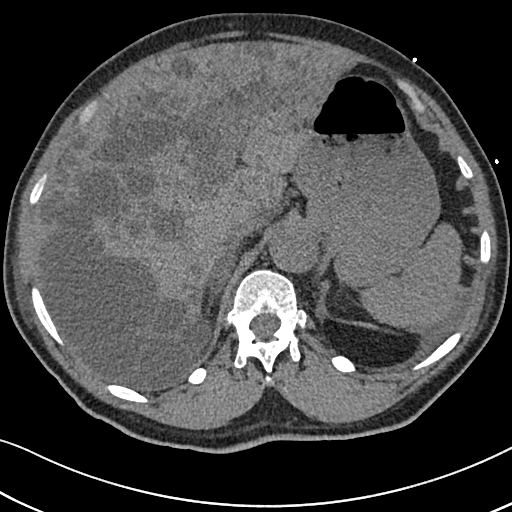
[im 236/325  soft-tissue]
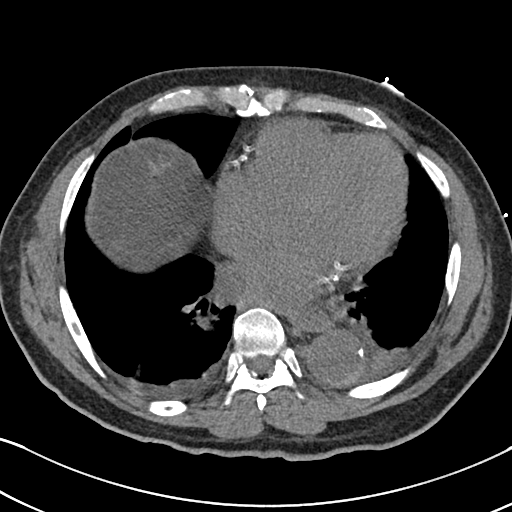
[im 266/325  soft-tissue]
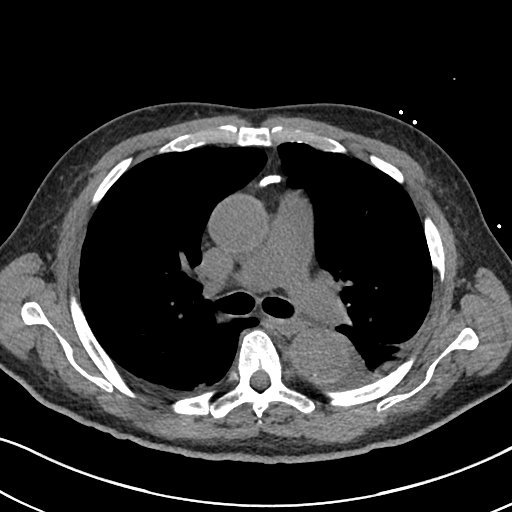
[im 295/325  soft-tissue]
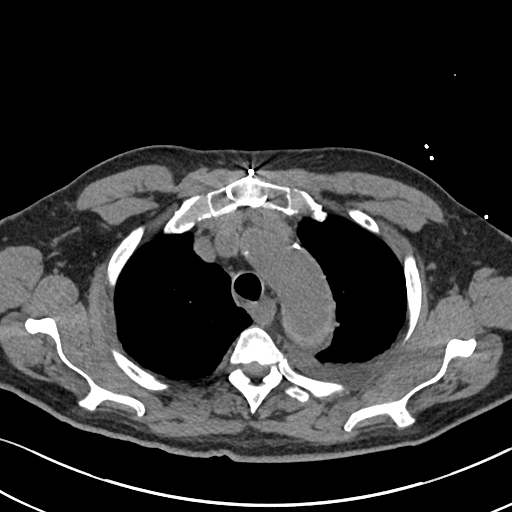
[im 295/325  bone]
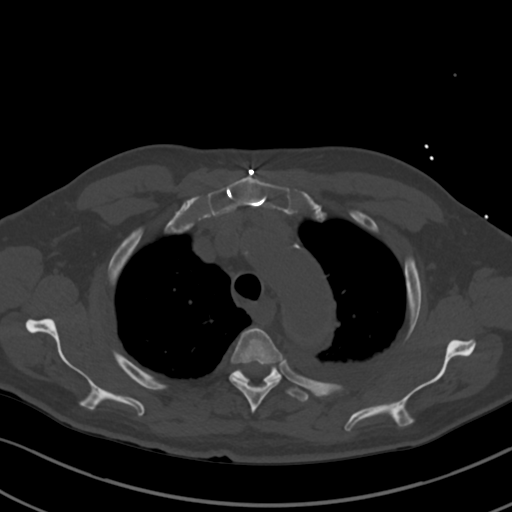

[12 of 46 positions shown; findings below may reference images not displayed]

FINDINGS: CT CHEST FINDINGS

Cardiovascular: Heart size is borderline enlarged. There is no
significant pericardial fluid, thickening or pericardial
calcification. There is aortic atherosclerosis, as well as
atherosclerosis of the great vessels of the mediastinum and the
coronary arteries, including calcified atherosclerotic plaque in the
left main, left anterior descending, left circumflex and right
coronary arteries. Status post median sternotomy for CABG, including
[REDACTED] to the LAD. Throughout much of the descending thoracic aorta
there is crescentic high attenuation associated with the posterior
and left lateral wall of the descending thoracic aorta. This is
poorly evaluated on today's noncontrast examination.

Mediastinum/Nodes: No pathologically enlarged mediastinal or hilar
lymph nodes. Please note that accurate exclusion of hilar adenopathy
is limited on noncontrast CT scans. Esophagus is unremarkable in
appearance. No axillary lymphadenopathy.

Lungs/Pleura: Moderate centrilobular and mild paraseptal emphysema.
Small bilateral low-attenuation pleural effusions layer dependently.
Some associated passive subsegmental atelectasis in the lower lobes
of the lungs bilaterally. No definite acute consolidative airspace
disease. No definite suspicious appearing pulmonary nodules or
masses.

Musculoskeletal: Median sternotomy wires. There are no aggressive
appearing lytic or blastic lesions noted in the visualized portions
of the skeleton.

CT ABDOMEN PELVIS FINDINGS

Hepatobiliary: There are innumerable low-attenuation lesions
scattered throughout the liver, incompletely characterized on
today's noncontrast CT examination, but statistically likely
multiple cysts. Gallbladder appears decompressed.

Pancreas: No definite pancreatic mass or peripancreatic inflammatory
changes on today's noncontrast CT examination.

Spleen: Unremarkable.

Adrenals/Urinary Tract: Throughout both kidneys there are
innumerable low, intermediate and high attenuation lesions which are
incompletely characterized on today's noncontrast CT examination,
but presumably represent a combination of simple cysts and complex
cysts, presumably in the setting of polycystic kidney disease. Some
of these cysts are located in the parapelvic regions. No
hydroureteronephrosis. Urinary bladder is nearly completely
decompressed around an indwelling Foley catheter, with a small
amount of gas non dependently in the lumen of the urinary bladder.
Adrenal glands are unremarkable in appearance bilaterally.

Stomach/Bowel: Unenhanced appearance of the stomach is normal. There
is no pathologic dilatation of small bowel or colon. The appendix is
not confidently identified and may be surgically absent. Regardless,
there are no inflammatory changes noted adjacent to the cecum to
suggest the presence of an acute appendicitis at this time.

Vascular/Lymphatic: Fusiform infrarenal abdominal aortic aneurysm
measuring up to 5.9 x 4.9 cm (axial image 208 of series 7).
Postprocedural changes of aorto bi-iliac stent graft are noted. The
patency of these grafts cannot be assessed on today's noncontrast
examination, nor can accurate assessment for endoleak be assessed.
There is also aneurysmal dilatation of the common iliac arteries
bilaterally which measure up to 3.4 cm in diameter on the right and
2.1 cm in diameter on the left. No lymphadenopathy noted in the
abdomen or pelvis on today's noncontrast CT examination.

Reproductive: Prostate gland and seminal vesicles are unremarkable
in appearance.

Other: Small volume of ascites predominantly in the low anatomic
pelvis and in the left paracolic gutter. No pneumoperitoneum.

Musculoskeletal: There are no aggressive appearing lytic or blastic
lesions noted in the visualized portions of the skeleton.
IMPRESSION: 1. Fusiform infrarenal abdominal aortic aneurysm measuring up to
x 4.9 cm. Vascular surgery consultation recommended (if not are the
performed) due to increased risk of rupture for AAA >5.5 cm. This
recommendation follows ACR consensus guidelines: White Paper of the
ACR Incidental Findings Committee II on Vascular Findings. [HOSPITAL] 0888; [DATE].
2. Recently recognized type B dissection is not well demonstrated on
today's noncontrast CT examination. However, there is considerable
crescentic high attenuation associated with the posterior and left
lateral wall of the descending thoracic aorta in the distribution of
the recently recognized type B dissection. This is favored to
represent extensive partial thrombosis of the false lumen rather
than evidence of recent development of intramural hematoma.
3. Stigmata of autosomal dominant polycystic kidney disease with
innumerable cystic lesions in the kidneys and liver, as above.
4. Small volume of ascites.
5. Additional incidental findings, as above.

## 2018-04-11 IMAGING — CR DG CHEST 1V PORT
1 series · 1 of 1 positions shown · non-contrast
Comparison: 09/08/2015

CLINICAL DATA: Pulmonary edema.  Shortness of breath.  Wheezing.

EXAM:
PORTABLE CHEST 1 VIEW

[AP]
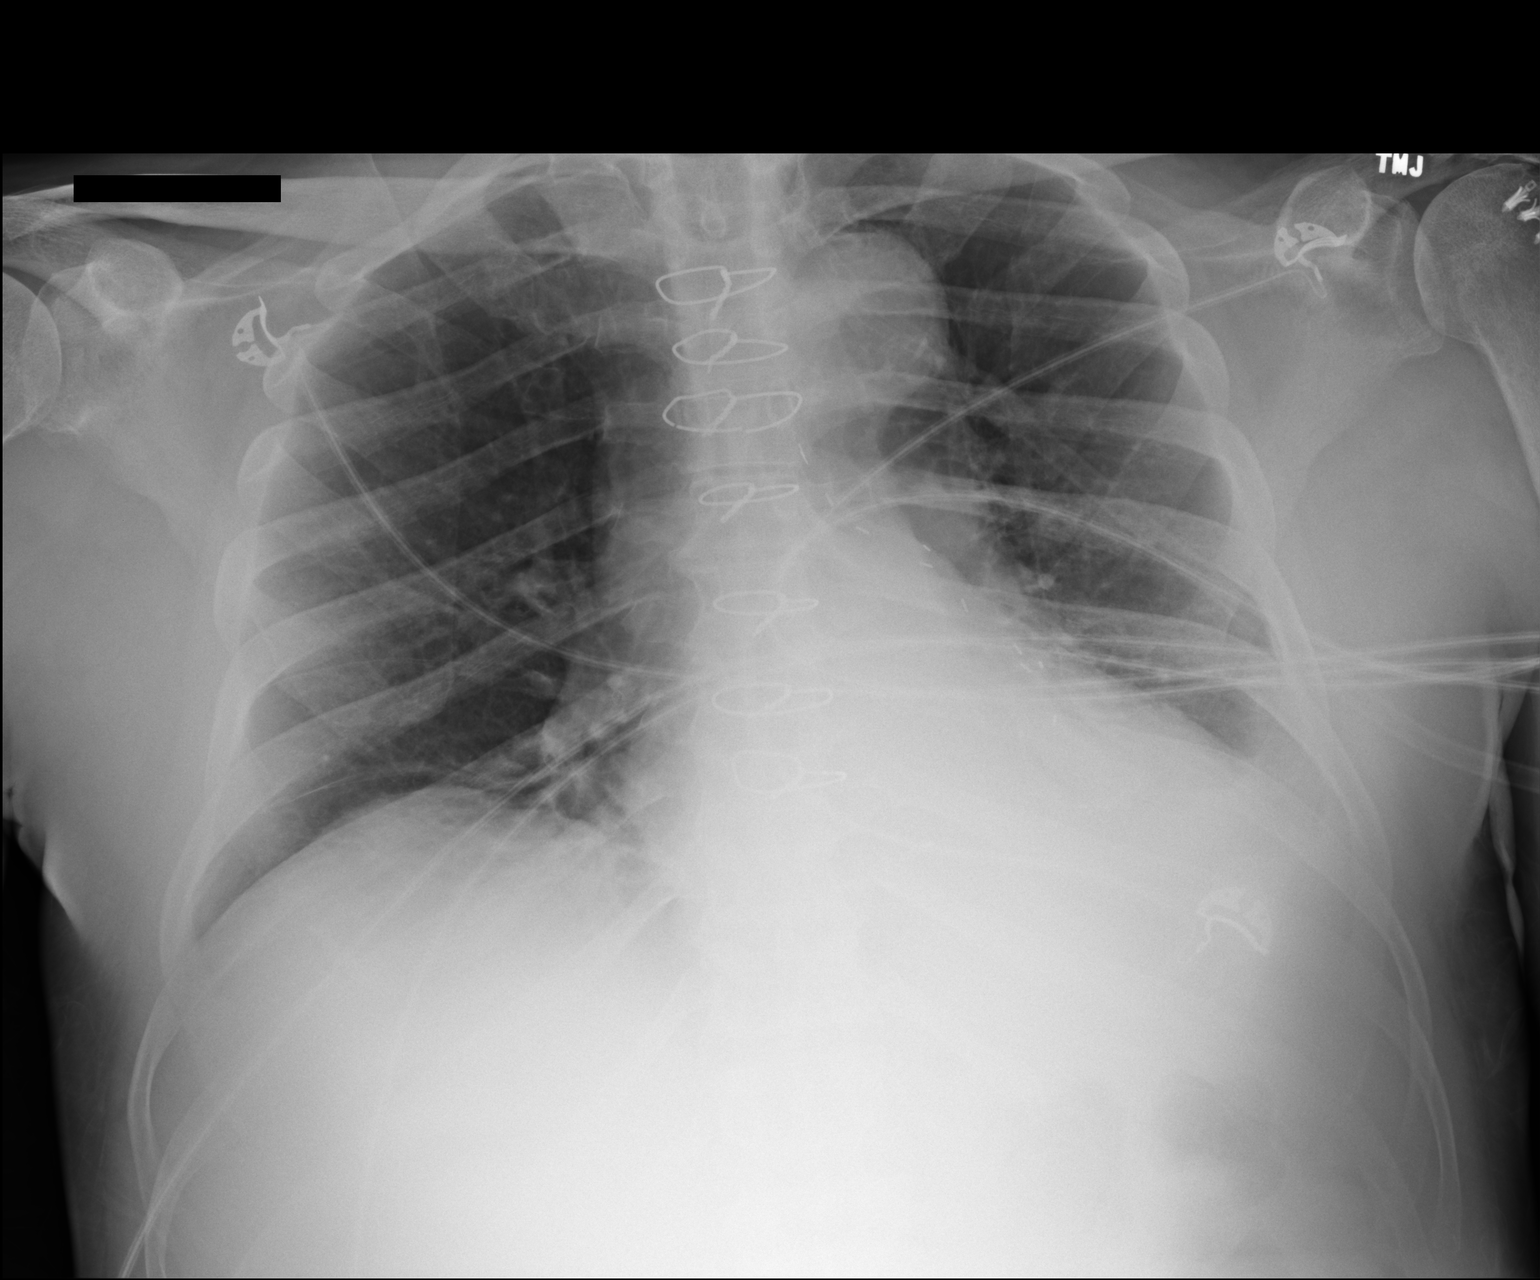

[1 of 1 positions shown; findings below may reference images not displayed]

FINDINGS: The heart size and pulmonary vascularity are normal. Tortuosity and
calcification of the thoracic aorta.

New small left effusion and left base atelectasis. Right lung is
clear.
IMPRESSION: New small left pleural

## 2023-12-05 DEATH — deceased
